# Patient Record
Sex: Female | Born: 1945 | Race: White | Hispanic: No | Marital: Married | Smoking: Never smoker
Health system: Southern US, Community
[De-identification: ages and names within clinical notes are randomized; demographics above are authoritative.]

## PROBLEM LIST (undated history)

## (undated) DIAGNOSIS — K219 Gastro-esophageal reflux disease without esophagitis: Secondary | ICD-10-CM

## (undated) DIAGNOSIS — N6019 Diffuse cystic mastopathy of unspecified breast: Secondary | ICD-10-CM

## (undated) DIAGNOSIS — I10 Essential (primary) hypertension: Secondary | ICD-10-CM

## (undated) DIAGNOSIS — E785 Hyperlipidemia, unspecified: Secondary | ICD-10-CM

## (undated) DIAGNOSIS — J349 Unspecified disorder of nose and nasal sinuses: Secondary | ICD-10-CM

## (undated) DIAGNOSIS — B029 Zoster without complications: Secondary | ICD-10-CM

## (undated) DIAGNOSIS — E119 Type 2 diabetes mellitus without complications: Secondary | ICD-10-CM

## (undated) DIAGNOSIS — M1990 Unspecified osteoarthritis, unspecified site: Secondary | ICD-10-CM

## (undated) DIAGNOSIS — K760 Fatty (change of) liver, not elsewhere classified: Secondary | ICD-10-CM

## (undated) DIAGNOSIS — E559 Vitamin D deficiency, unspecified: Secondary | ICD-10-CM

## (undated) DIAGNOSIS — K635 Polyp of colon: Secondary | ICD-10-CM

## (undated) DIAGNOSIS — K649 Unspecified hemorrhoids: Secondary | ICD-10-CM

## (undated) HISTORY — DX: Polyp of colon: K63.5

## (undated) HISTORY — PX: BLEPHAROPLASTY: SUR158

## (undated) HISTORY — DX: Gastro-esophageal reflux disease without esophagitis: K21.9

## (undated) HISTORY — DX: Zoster without complications: B02.9

## (undated) HISTORY — DX: Unspecified disorder of nose and nasal sinuses: J34.9

## (undated) HISTORY — DX: Diffuse cystic mastopathy of unspecified breast: N60.19

## (undated) HISTORY — PX: TONSILLECTOMY: SUR1361

## (undated) HISTORY — DX: Unspecified hemorrhoids: K64.9

## (undated) HISTORY — PX: OTHER SURGICAL HISTORY: SHX169

## (undated) HISTORY — PX: CATARACT EXTRACTION, BILATERAL: SHX1313

---

## 1990-02-13 HISTORY — PX: ABDOMINAL HYSTERECTOMY: SHX81

## 1998-02-13 HISTORY — PX: NISSEN FUNDOPLICATION: SHX2091

## 2004-06-30 ENCOUNTER — Ambulatory Visit: Payer: Self-pay | Admitting: General Surgery

## 2005-07-18 ENCOUNTER — Ambulatory Visit: Payer: Self-pay | Admitting: General Surgery

## 2006-07-19 ENCOUNTER — Ambulatory Visit: Payer: Self-pay | Admitting: General Surgery

## 2007-02-14 HISTORY — PX: COLONOSCOPY: SHX174

## 2007-04-29 ENCOUNTER — Ambulatory Visit: Payer: Self-pay | Admitting: Unknown Physician Specialty

## 2007-06-24 ENCOUNTER — Ambulatory Visit: Payer: Self-pay | Admitting: Unknown Physician Specialty

## 2007-07-30 ENCOUNTER — Ambulatory Visit: Payer: Self-pay | Admitting: General Surgery

## 2007-08-12 ENCOUNTER — Ambulatory Visit: Payer: Self-pay | Admitting: Urology

## 2008-02-14 HISTORY — PX: NASAL RECONSTRUCTION: SHX2069

## 2008-08-14 ENCOUNTER — Ambulatory Visit: Payer: Self-pay | Admitting: General Surgery

## 2009-06-01 ENCOUNTER — Ambulatory Visit: Payer: Self-pay | Admitting: Family Medicine

## 2009-08-17 ENCOUNTER — Ambulatory Visit: Payer: Self-pay | Admitting: General Surgery

## 2010-02-13 DIAGNOSIS — B029 Zoster without complications: Secondary | ICD-10-CM

## 2010-02-13 HISTORY — PX: NASAL SINUS SURGERY: SHX719

## 2010-02-13 HISTORY — DX: Zoster without complications: B02.9

## 2010-08-19 ENCOUNTER — Ambulatory Visit: Payer: Self-pay | Admitting: General Surgery

## 2010-09-20 ENCOUNTER — Ambulatory Visit: Payer: Self-pay | Admitting: Specialist

## 2010-09-27 ENCOUNTER — Ambulatory Visit: Payer: Self-pay | Admitting: Specialist

## 2011-04-20 ENCOUNTER — Ambulatory Visit: Payer: Self-pay | Admitting: Otolaryngology

## 2011-04-24 LAB — PATHOLOGY REPORT

## 2011-08-22 ENCOUNTER — Ambulatory Visit: Payer: Self-pay | Admitting: General Surgery

## 2012-08-22 ENCOUNTER — Ambulatory Visit: Payer: Self-pay | Admitting: General Surgery

## 2012-08-22 ENCOUNTER — Encounter: Payer: Self-pay | Admitting: General Surgery

## 2012-09-17 ENCOUNTER — Encounter: Payer: Self-pay | Admitting: General Surgery

## 2012-09-17 ENCOUNTER — Ambulatory Visit (INDEPENDENT_AMBULATORY_CARE_PROVIDER_SITE_OTHER): Payer: BC Managed Care – PPO | Admitting: General Surgery

## 2012-09-17 VITALS — BP 130/74 | HR 76 | Resp 14 | Ht 65.0 in | Wt 210.0 lb

## 2012-09-17 DIAGNOSIS — Z87898 Personal history of other specified conditions: Secondary | ICD-10-CM | POA: Insufficient documentation

## 2012-09-17 DIAGNOSIS — Z1239 Encounter for other screening for malignant neoplasm of breast: Secondary | ICD-10-CM

## 2012-09-17 NOTE — Patient Instructions (Addendum)
Patient to return in 1 year with bilateral screening mammogram. Continue self breast checks.

## 2012-09-17 NOTE — Progress Notes (Signed)
Patient ID: Brianna Dudley, female   DOB: 1945-04-17, 67 y.o.   MRN: [id number]  Chief Complaint  Patient presents with  . Follow-up    mammogram    HPI Brianna Dudley is a 67 y.o. female.  who presents for her annual breast evaluation. The most recent mammogram was done on 08-22-12 with a birad category 2 . Patient does perform regular self breast checks and gets regular mammograms done.  The patient denies any problems with the breasts at this time.   HPI  Past Medical History  Diagnosis Date  . Diffuse cystic mastopathy   . Shingles 2012  . Colon polyp   . GERD (gastroesophageal reflux disease)   . Sinus problem   . Hemorrhoid     Past Surgical History  Procedure Laterality Date  . Colonoscopy  2009    Dr Mechele Collin  . Nasal reconstruction  2010  . Vocal chord surgery     . Nissen fundoplication  2000  . Abdominal hysterectomy  1992  . Nasal sinus surgery  2012    Family History  Problem Relation Age of Onset  . Heart disease Mother   . Cancer Father     Social History History  Substance Use Topics  . Smoking status: Never Smoker   . Smokeless tobacco: Never Used  . Alcohol Use: No    Allergies  Allergen Reactions  . Codeine Nausea Only  . Penicillins Hives    Current Outpatient Prescriptions  Medication Sig Dispense Refill  . atorvastatin (LIPITOR) 40 MG tablet Take 40 mg by mouth daily.       Marland Kitchen FLUTICASONE PROPIONATE, NASAL, NA Place 2 puffs into the nose daily.      Marland Kitchen lisinopril-hydrochlorothiazide (PRINZIDE,ZESTORETIC) 20-12.5 MG per tablet Take 1 tablet by mouth daily.       . Multiple Vitamin (MULTIVITAMIN) capsule Take 1 capsule by mouth daily.       No current facility-administered medications for this visit.    Review of Systems Review of Systems  Constitutional: Negative.   Respiratory: Negative.   Cardiovascular: Negative.     Blood pressure 130/74, pulse 76, resp. rate 14, height 5\' 5"  (1.651 m), weight 210 lb (95.255 kg).  Physical  Exam Physical Exam  Constitutional: She is oriented to person, place, and time. She appears well-developed and well-nourished.  Eyes: Conjunctivae are normal. No scleral icterus.  Neck: No thyromegaly present.  Cardiovascular: Normal rate, regular rhythm and normal heart sounds.   No murmur heard. Pulmonary/Chest: Effort normal and breath sounds normal. Right breast exhibits no inverted nipple, no mass, no nipple discharge, no skin change and no tenderness. Left breast exhibits no inverted nipple, no mass, no nipple discharge, no skin change and no tenderness.  Abdominal: Soft. Bowel sounds are normal. There is no tenderness.  Lymphadenopathy:    She has no cervical adenopathy.    She has no axillary adenopathy.  Neurological: She is alert and oriented to person, place, and time.  Skin: Skin is warm and dry.    Data Reviewed  Mammogram reviewed.   Assessment    Exam stable.        Plan    Return in 1 year with bilateral screening mammogram.        Brianna Dudley G 09/17/2012, 7:37 PM

## 2012-12-09 ENCOUNTER — Ambulatory Visit: Payer: Self-pay | Admitting: Unknown Physician Specialty

## 2013-09-25 ENCOUNTER — Ambulatory Visit: Payer: BC Managed Care – PPO | Admitting: General Surgery

## 2013-10-13 ENCOUNTER — Other Ambulatory Visit: Payer: Medicare PPO

## 2013-10-13 ENCOUNTER — Encounter: Payer: Self-pay | Admitting: General Surgery

## 2013-10-13 ENCOUNTER — Ambulatory Visit (INDEPENDENT_AMBULATORY_CARE_PROVIDER_SITE_OTHER): Payer: Medicare PPO | Admitting: General Surgery

## 2013-10-13 VITALS — BP 110/80 | HR 68 | Resp 16 | Ht 64.0 in | Wt 208.0 lb

## 2013-10-13 DIAGNOSIS — N644 Mastodynia: Secondary | ICD-10-CM | POA: Diagnosis not present

## 2013-10-13 DIAGNOSIS — Z87898 Personal history of other specified conditions: Secondary | ICD-10-CM | POA: Diagnosis not present

## 2013-10-13 NOTE — Patient Instructions (Addendum)
This patient is to have a bilateral screening mammogram at the [facility] on 11-12-13 at 8:20 am. She is aware of date, time, and instructions.   Patient to return in 1 year for follow up. Continue self breast exams. Call office for any new breast issues or concerns.

## 2013-10-13 NOTE — Progress Notes (Signed)
Patient ID: KARYME MCCONATHY, female   DOB: 04/09/1945, 68 y.o.   MRN: [id number]  Chief Complaint  Patient presents with  . Follow-up    right nipple pain    HPI ROSAELENA KEMNITZ is a 68 y.o. female who presents for an evaluation of right nipple pain. No mammogram done at this time. The pain started approximately 1 month ago. She states it feels like a bruise when touching the area. No discharge noted. Overall doing well.   HPI  Past Medical History  Diagnosis Date  . Diffuse cystic mastopathy   . Shingles 2012  . Colon polyp   . GERD (gastroesophageal reflux disease)   . Sinus problem   . Hemorrhoid     Past Surgical History  Procedure Laterality Date  . Colonoscopy  2009    Dr Mechele Collin  . Nasal reconstruction  2010  . Vocal chord surgery     . Nissen fundoplication  2000  . Abdominal hysterectomy  1992  . Nasal sinus surgery  2012    Family History  Problem Relation Age of Onset  . Heart disease Mother   . Cancer Father     Social History History  Substance Use Topics  . Smoking status: Never Smoker   . Smokeless tobacco: Never Used  . Alcohol Use: No    Allergies  Allergen Reactions  . Codeine Nausea Only  . Penicillins Hives    Current Outpatient Prescriptions  Medication Sig Dispense Refill  . atorvastatin (LIPITOR) 40 MG tablet Take 40 mg by mouth daily.       Marland Kitchen lisinopril-hydrochlorothiazide (PRINZIDE,ZESTORETIC) 20-12.5 MG per tablet Take 1 tablet by mouth daily.       Marland Kitchen FLUTICASONE PROPIONATE, NASAL, NA Place 2 puffs into the nose daily.      . Multiple Vitamin (MULTIVITAMIN) capsule Take 1 capsule by mouth daily.       No current facility-administered medications for this visit.    Review of Systems Review of Systems  Constitutional: Negative.   Respiratory: Negative.   Cardiovascular: Negative.     Blood pressure 110/80, pulse 68, resp. rate 16, height  (1.626 m), weight 208 lb (94.348 kg).  Physical Exam Physical Exam   Constitutional: She is oriented to person, place, and time. She appears well-developed and well-nourished.  Eyes: Conjunctivae are normal. No scleral icterus.  Neck: Neck supple. No thyromegaly present.  Cardiovascular: Normal rate, regular rhythm and normal heart sounds.   No murmur heard. Pulmonary/Chest: Effort normal and breath sounds normal. Right breast exhibits tenderness (focally tender just medial to the nipple. No palpable mass or discharge.). Right breast exhibits no inverted nipple, no mass, no nipple discharge and no skin change. Left breast exhibits no inverted nipple, no mass, no nipple discharge, no skin change and no tenderness.  Lymphadenopathy:    She has no cervical adenopathy.    She has no axillary adenopathy.  Neurological: She is alert and oriented to person, place, and time.  Skin: Skin is warm and dry.    Data Reviewed Prior notes. [place] of right breast  medial to nipple was performed. No abnormality noted Assessment    Right breast pain focal near nipple-no findings on exam or on [place]. Pt reassured. Has history of FCD     Plan    Patient to have a bilateral screening mammogram at the [facility] on 11-12-13 at 8:20 am. She is aware of date, time, and instructions.   This patient will also  be asked to have a bilateral screening mammogram in one year.        SANKAR,SEEPLAPUTHUR G 10/13/2013, 11:04 AM

## 2013-11-12 ENCOUNTER — Encounter: Payer: Self-pay | Admitting: General Surgery

## 2013-11-12 ENCOUNTER — Ambulatory Visit: Payer: Self-pay | Admitting: General Surgery

## 2013-12-15 ENCOUNTER — Encounter: Payer: Self-pay | Admitting: General Surgery

## 2014-01-29 DIAGNOSIS — E785 Hyperlipidemia, unspecified: Secondary | ICD-10-CM | POA: Insufficient documentation

## 2014-01-29 DIAGNOSIS — I10 Essential (primary) hypertension: Secondary | ICD-10-CM | POA: Insufficient documentation

## 2014-01-29 DIAGNOSIS — E559 Vitamin D deficiency, unspecified: Secondary | ICD-10-CM | POA: Insufficient documentation

## 2014-01-29 DIAGNOSIS — K219 Gastro-esophageal reflux disease without esophagitis: Secondary | ICD-10-CM | POA: Insufficient documentation

## 2014-08-10 DIAGNOSIS — E119 Type 2 diabetes mellitus without complications: Secondary | ICD-10-CM | POA: Insufficient documentation

## 2014-08-24 ENCOUNTER — Other Ambulatory Visit: Payer: Self-pay

## 2014-08-24 DIAGNOSIS — Z1231 Encounter for screening mammogram for malignant neoplasm of breast: Secondary | ICD-10-CM

## 2014-11-16 ENCOUNTER — Ambulatory Visit: Payer: Self-pay

## 2014-11-16 ENCOUNTER — Ambulatory Visit
Admission: RE | Admit: 2014-11-16 | Discharge: 2014-11-16 | Disposition: A | Discharge status: Home / Self Care | Payer: Medicare PPO | Source: Ambulatory Visit | Attending: General Surgery | Admitting: General Surgery

## 2014-11-16 ENCOUNTER — Other Ambulatory Visit: Payer: Self-pay | Admitting: General Surgery

## 2014-11-16 DIAGNOSIS — Z1231 Encounter for screening mammogram for malignant neoplasm of breast: Secondary | ICD-10-CM | POA: Insufficient documentation

## 2014-11-24 ENCOUNTER — Ambulatory Visit: Payer: Medicare PPO | Admitting: General Surgery

## 2015-08-26 ENCOUNTER — Other Ambulatory Visit: Payer: Self-pay | Admitting: Physician Assistant

## 2015-08-26 DIAGNOSIS — Z1231 Encounter for screening mammogram for malignant neoplasm of breast: Secondary | ICD-10-CM

## 2015-10-10 ENCOUNTER — Ambulatory Visit (INDEPENDENT_AMBULATORY_CARE_PROVIDER_SITE_OTHER): Payer: Medicare PPO

## 2015-10-10 ENCOUNTER — Ambulatory Visit
Admission: EM | Admit: 2015-10-10 | Discharge: 2015-10-10 | Disposition: A | Discharge status: Home / Self Care | Payer: Medicare PPO | Attending: Family Medicine | Admitting: Family Medicine

## 2015-10-10 DIAGNOSIS — S20212A Contusion of left front wall of thorax, initial encounter: Secondary | ICD-10-CM

## 2015-10-10 MED ORDER — NAPROXEN 500 MG PO TABS: 500.0000 mg | ORAL_TABLET | Freq: Two times a day (BID) | ORAL | 0 refills | Status: DC

## 2015-10-10 MED ORDER — TRAMADOL HCL 50 MG PO TABS
50.0000 mg | ORAL_TABLET | Freq: Every evening | ORAL | 0 refills | Status: DC | PRN
Start: 2015-10-10 — End: 2020-04-13

## 2015-10-10 NOTE — ED Provider Notes (Signed)
CSN: [id number]     Arrival date & time 10/10/15  0800 History   First MD Initiated Contact with Patient 10/10/15 0820     Chief Complaint  Patient presents with  . Fall   (Consider location/radiation/quality/duration/timing/severity/associated sxs/prior Treatment) 70 year old female presents with left sided rib pain from fall 2 days ago. She was power-washing her driveway when she stepped in a hole in the lawn and fell on her left side. She hit her shoulder, ribs and hip but only her ribs hurt now. She has taken some Tylenol PM which helped her sleep last night. Pain is worse when she coughs or laughs. No previous injury to chest/ribs.    The history is provided by the patient.  Fall  Pertinent negatives include no chest pain and no abdominal pain.    Past Medical History:  Diagnosis Date  . Colon polyp   . Diffuse cystic mastopathy   . GERD (gastroesophageal reflux disease)   . Hemorrhoid   . Shingles 2012  . Sinus problem    Past Surgical History:  Procedure Laterality Date  . ABDOMINAL HYSTERECTOMY  1992  . COLONOSCOPY  2009   Dr Mechele CollinElliott  . NASAL RECONSTRUCTION  2010  . NASAL SINUS SURGERY  2012  . NISSEN FUNDOPLICATION  2000  . vocal chord surgery      Family History  Problem Relation Age of Onset  . Heart disease Mother   . Cancer Father    Social History  Substance Use Topics  . Smoking status: Never Smoker  . Smokeless tobacco: Never Used  . Alcohol use No   OB History    Gravida Para Term Preterm AB Living   2 2           SAB TAB Ectopic Multiple Live Births                  Obstetric Comments   1st Menstrual Cycle:  15 1st Pregnancy:  25     Review of Systems  Constitutional: Negative for chills, fatigue and fever.  HENT: Positive for congestion and postnasal drip.   Respiratory: Positive for cough.   Cardiovascular: Negative for chest pain.  Gastrointestinal: Negative for abdominal pain.  Genitourinary: Negative for difficulty urinating,  dysuria and flank pain.  Musculoskeletal: Positive for arthralgias (rib pain). Negative for back pain, joint swelling and neck pain.  Skin: Negative.   Neurological: Negative for dizziness, syncope, weakness and light-headedness.    Allergies  Codeine and Penicillins  Home Medications   Prior to Admission medications   Medication Sig Start Date End Date Taking? Authorizing Provider  atorvastatin (LIPITOR) 40 MG tablet Take 40 mg by mouth daily.  08/01/12  Yes Historical Provider, MD  azelastine (ASTELIN) 0.1 % nasal spray Place 1 spray into both nostrils 2 (two) times daily. Use in each nostril as directed   Yes Historical Provider, MD  lisinopril-hydrochlorothiazide (PRINZIDE,ZESTORETIC) 20-12.5 MG per tablet Take 1 tablet by mouth daily.  08/01/12  Yes Historical Provider, MD  Multiple Vitamin (MULTIVITAMIN) capsule Take 1 capsule by mouth daily.   Yes Historical Provider, MD  naproxen (NAPROSYN) 500 MG tablet Take 1 tablet (500 mg total) by mouth 2 (two) times daily. 10/10/15   Sudie GrumblingAnn Berry Derrell Milanes, NP  traMADol (ULTRAM) 50 MG tablet Take 1 tablet (50 mg total) by mouth at bedtime as needed for moderate pain. 10/10/15   Sudie GrumblingAnn Berry Torrey Ballinas, NP   Meds Ordered and Administered this Visit  Medications - No data  to display  BP 130/65 (BP Location: Right Arm)   Pulse 88   Temp 98 F (36.7 C) (Oral)   Resp 18   Ht 5\' 4"  (1.626 m)   Wt 198 lb (89.8 kg)   SpO2 96%   BMI 33.99 kg/m  No data found.   Physical Exam  Constitutional: She is oriented to person, place, and time. She appears well-developed and well-nourished. No distress.  HENT:  Head: Normocephalic and atraumatic.  Neck: Normal range of motion. Neck supple.  Cardiovascular: Normal rate, regular rhythm and normal heart sounds.     Tenderness near the 7th and 8th rib line mid-clavicular to axillary. No bruising or swelling present.   Pulmonary/Chest: Effort normal and breath sounds normal. No respiratory distress. She has no  wheezes. She exhibits tenderness. She exhibits no crepitus, no edema and no swelling.  Musculoskeletal: Normal range of motion. She exhibits tenderness.  Lymphadenopathy:    She has no cervical adenopathy.  Neurological: She is alert and oriented to person, place, and time.  Skin: Skin is warm and dry. Capillary refill takes less than 2 seconds.  Psychiatric: She has a normal mood and affect. Her behavior is normal. Judgment and thought content normal.    Urgent Care Course   Clinical Course    Procedures (including critical care time)  Labs Review Labs Reviewed - No data to display  Imaging Review Dg Ribs Unilateral W/chest Left  Result Date: 10/10/2015 CLINICAL DATA:  LEFT-sided chest pain, fall.  Fall 2 days prior. EXAM: LEFT RIBS AND CHEST - 3+ VIEW COMPARISON:  None. FINDINGS: Normal cardiac silhouette. No pneumothorax or pulmonary contusion. Dedicated views of the LEFT ribs demonstrate no displaced fracture. IMPRESSION: No fracture or pneumothorax. Electronically Signed   By: Genevive Bi M.D.   On: 10/10/2015 08:58     Visual Acuity Review  Right Eye Distance:   Left Eye Distance:   Bilateral Distance:    Right Eye Near:   Left Eye Near:    Bilateral Near:         MDM   1. Chest wall contusion, left, initial encounter    Reviewed imaging results which showed no fracture. Discussed that she most likely bruised her rib cage. Recommend Naproxen 500mg  every 12 hours as needed for pain. Offered various cough medication to help decrease cough aggravating rib pain but she denied- "nothing has worked in the past. I have a chronic cough with allergies that my ENT is still trying to manage". Provided Rx for Tramadol 50mg  #7 to take 1 at bedtime for pain. Recommend follow-up with her primary care provider if pain is not improving within 7 to 10 days.     Sudie Grumbling, NP 10/11/15 [phone]

## 2015-10-10 NOTE — ED Triage Notes (Signed)
Patient was pressure washing her driveway and she stepped back into a hole. This happened Friday evening.

## 2015-10-10 NOTE — Discharge Instructions (Signed)
Take Naproxen twice a day as needed for pain. May take Tramadol at night to help with pain and to help sleep. Encouraged to continue to breathe normally and cough as needed to minimize risk of further complications. Follow-up with your primary care provider within 5 days if not improving.

## 2015-11-18 ENCOUNTER — Other Ambulatory Visit: Payer: Self-pay | Admitting: Physician Assistant

## 2015-11-18 ENCOUNTER — Ambulatory Visit
Admission: RE | Admit: 2015-11-18 | Discharge: 2015-11-18 | Disposition: A | Discharge status: Home / Self Care | Payer: Medicare PPO | Source: Ambulatory Visit | Attending: Physician Assistant | Admitting: Physician Assistant

## 2015-11-18 DIAGNOSIS — Z1231 Encounter for screening mammogram for malignant neoplasm of breast: Secondary | ICD-10-CM

## 2016-10-09 ENCOUNTER — Other Ambulatory Visit: Payer: Self-pay | Admitting: Physician Assistant

## 2016-10-09 DIAGNOSIS — Z1231 Encounter for screening mammogram for malignant neoplasm of breast: Secondary | ICD-10-CM

## 2016-11-20 ENCOUNTER — Ambulatory Visit
Admission: RE | Admit: 2016-11-20 | Discharge: 2016-11-20 | Disposition: A | Discharge status: Home / Self Care | Payer: Medicare PPO | Source: Ambulatory Visit | Attending: Physician Assistant | Admitting: Physician Assistant

## 2016-11-20 DIAGNOSIS — Z1231 Encounter for screening mammogram for malignant neoplasm of breast: Secondary | ICD-10-CM | POA: Insufficient documentation

## 2016-11-28 ENCOUNTER — Other Ambulatory Visit: Payer: Self-pay | Admitting: Otolaryngology

## 2016-11-28 DIAGNOSIS — R221 Localized swelling, mass and lump, neck: Secondary | ICD-10-CM

## 2016-12-07 ENCOUNTER — Ambulatory Visit
Admission: RE | Admit: 2016-12-07 | Discharge: 2016-12-07 | Disposition: A | Discharge status: Home / Self Care | Payer: Medicare PPO | Source: Ambulatory Visit | Attending: Otolaryngology | Admitting: Otolaryngology

## 2016-12-07 DIAGNOSIS — R600 Localized edema: Secondary | ICD-10-CM | POA: Insufficient documentation

## 2016-12-07 DIAGNOSIS — H9209 Otalgia, unspecified ear: Secondary | ICD-10-CM | POA: Diagnosis present

## 2016-12-07 DIAGNOSIS — R221 Localized swelling, mass and lump, neck: Secondary | ICD-10-CM

## 2016-12-07 HISTORY — DX: Essential (primary) hypertension: I10

## 2016-12-07 LAB — POCT I-STAT CREATININE: Creatinine, Ser: 0.9 mg/dL (ref 0.44–1.00)

## 2016-12-07 MED ORDER — IOPAMIDOL (ISOVUE-300) INJECTION 61%
75.0000 mL | Freq: Once | INTRAVENOUS | Status: AC | PRN
  Administered 2016-12-07: 75 mL via INTRAVENOUS

## 2017-10-17 ENCOUNTER — Other Ambulatory Visit: Payer: Self-pay | Admitting: Physician Assistant

## 2017-10-17 DIAGNOSIS — Z1231 Encounter for screening mammogram for malignant neoplasm of breast: Secondary | ICD-10-CM

## 2017-11-21 ENCOUNTER — Ambulatory Visit
Admission: RE | Admit: 2017-11-21 | Discharge: 2017-11-21 | Disposition: A | Discharge status: Home / Self Care | Payer: Medicare PPO | Source: Ambulatory Visit | Attending: Physician Assistant | Admitting: Physician Assistant

## 2017-11-21 DIAGNOSIS — Z1231 Encounter for screening mammogram for malignant neoplasm of breast: Secondary | ICD-10-CM | POA: Diagnosis not present

## 2017-11-28 DIAGNOSIS — Z86010 Personal history of colonic polyps: Secondary | ICD-10-CM | POA: Insufficient documentation

## 2018-04-09 ENCOUNTER — Other Ambulatory Visit: Payer: Self-pay | Admitting: Physician Assistant

## 2018-04-09 ENCOUNTER — Other Ambulatory Visit (HOSPITAL_COMMUNITY): Payer: Self-pay | Admitting: Physician Assistant

## 2018-04-09 ENCOUNTER — Other Ambulatory Visit (HOSPITAL_COMMUNITY): Payer: Self-pay

## 2018-04-09 DIAGNOSIS — R945 Abnormal results of liver function studies: Principal | ICD-10-CM

## 2018-04-09 DIAGNOSIS — R7989 Other specified abnormal findings of blood chemistry: Secondary | ICD-10-CM

## 2018-04-11 ENCOUNTER — Ambulatory Visit
Admission: RE | Admit: 2018-04-11 | Discharge: 2018-04-11 | Disposition: A | Discharge status: Home / Self Care | Payer: Medicare Other | Source: Ambulatory Visit | Attending: Physician Assistant | Admitting: Physician Assistant

## 2018-04-11 DIAGNOSIS — R7989 Other specified abnormal findings of blood chemistry: Secondary | ICD-10-CM

## 2018-04-11 DIAGNOSIS — R945 Abnormal results of liver function studies: Secondary | ICD-10-CM | POA: Insufficient documentation

## 2018-10-08 ENCOUNTER — Other Ambulatory Visit: Payer: Self-pay | Admitting: Physician Assistant

## 2018-10-08 DIAGNOSIS — Z1231 Encounter for screening mammogram for malignant neoplasm of breast: Secondary | ICD-10-CM

## 2018-11-25 ENCOUNTER — Ambulatory Visit
Admission: RE | Admit: 2018-11-25 | Discharge: 2018-11-25 | Disposition: A | Discharge status: Home / Self Care | Payer: Medicare Other | Source: Ambulatory Visit | Attending: Physician Assistant | Admitting: Physician Assistant

## 2018-11-25 DIAGNOSIS — Z1231 Encounter for screening mammogram for malignant neoplasm of breast: Secondary | ICD-10-CM | POA: Insufficient documentation

## 2019-03-24 ENCOUNTER — Other Ambulatory Visit: Payer: Medicare Other

## 2019-04-10 ENCOUNTER — Ambulatory Visit: Payer: Medicare Other | Attending: Internal Medicine

## 2019-04-10 DIAGNOSIS — Z23 Encounter for immunization: Secondary | ICD-10-CM

## 2019-04-10 NOTE — Progress Notes (Signed)
   Covid-19 Vaccination Clinic  Name:  Brianna Dudley    MRN: [id number] DOB: August 22, 1945  04/10/2019  Brianna Dudley was observed post Covid-19 immunization for 15 minutes without incidence. She was provided with Vaccine Information Sheet and instruction to access the V-Safe system.   Brianna Dudley was instructed to call 911 with any severe reactions post vaccine: Marland Kitchen Difficulty breathing  . Swelling of your face and throat  . A fast heartbeat  . A bad rash all over your body  . Dizziness and weakness    Immunizations Administered    Name Date Dose VIS Date Route   Pfizer COVID-19 Vaccine 04/10/2019  9:58 AM 0.3 mL 01/24/2019 Intramuscular   Manufacturer: [employer], [employer]   Lot: [id number]   NDC: [address]-8

## 2019-05-06 ENCOUNTER — Ambulatory Visit: Payer: Medicare Other | Attending: Internal Medicine

## 2019-05-06 DIAGNOSIS — Z23 Encounter for immunization: Secondary | ICD-10-CM

## 2019-05-06 NOTE — Progress Notes (Signed)
   Covid-19 Vaccination Clinic  Name:  CYANA SHOOK    MRN: [id number] DOB: [birth year]  05/06/2019  Ms. Tilly was observed post Covid-19 immunization for 15 minutes without incident. She was provided with Vaccine Information Sheet and instruction to access the V-Safe system.   Ms. Galloway was instructed to call 911 with any severe reactions post vaccine: Marland Kitchen Difficulty breathing  . Swelling of face and throat  . A fast heartbeat  . A bad rash all over body  . Dizziness and weakness   Immunizations Administered    Name Date Dose VIS Date Route   Pfizer COVID-19 Vaccine 05/06/2019  9:42 AM 0.3 mL 01/24/2019 Intramuscular   Manufacturer: [employer], [employer]   Lot: [id number]   NDC: [address]-2

## 2019-09-30 ENCOUNTER — Other Ambulatory Visit: Payer: Self-pay | Admitting: Sports Medicine

## 2019-09-30 DIAGNOSIS — M7052 Other bursitis of knee, left knee: Secondary | ICD-10-CM

## 2019-09-30 DIAGNOSIS — M1712 Unilateral primary osteoarthritis, left knee: Secondary | ICD-10-CM

## 2019-09-30 DIAGNOSIS — G8929 Other chronic pain: Secondary | ICD-10-CM

## 2019-09-30 DIAGNOSIS — M25562 Pain in left knee: Secondary | ICD-10-CM

## 2019-10-17 ENCOUNTER — Ambulatory Visit
Admission: RE | Admit: 2019-10-17 | Discharge: 2019-10-17 | Disposition: A | Discharge status: Home / Self Care | Payer: Medicare Other | Source: Ambulatory Visit | Attending: Sports Medicine | Admitting: Sports Medicine

## 2019-10-17 ENCOUNTER — Other Ambulatory Visit: Payer: Self-pay

## 2019-10-17 DIAGNOSIS — G8929 Other chronic pain: Secondary | ICD-10-CM | POA: Diagnosis present

## 2019-10-17 DIAGNOSIS — M7052 Other bursitis of knee, left knee: Secondary | ICD-10-CM | POA: Diagnosis present

## 2019-10-17 DIAGNOSIS — M1712 Unilateral primary osteoarthritis, left knee: Secondary | ICD-10-CM | POA: Diagnosis present

## 2019-10-17 DIAGNOSIS — M25562 Pain in left knee: Secondary | ICD-10-CM | POA: Insufficient documentation

## 2019-10-21 ENCOUNTER — Other Ambulatory Visit: Payer: Self-pay | Admitting: Physician Assistant

## 2019-10-21 DIAGNOSIS — Z1231 Encounter for screening mammogram for malignant neoplasm of breast: Secondary | ICD-10-CM

## 2019-11-27 ENCOUNTER — Ambulatory Visit
Admission: RE | Admit: 2019-11-27 | Discharge: 2019-11-27 | Disposition: A | Discharge status: Home / Self Care | Payer: Medicare Other | Source: Ambulatory Visit | Attending: Physician Assistant | Admitting: Physician Assistant

## 2019-11-27 ENCOUNTER — Other Ambulatory Visit: Payer: Self-pay

## 2019-11-27 DIAGNOSIS — Z1231 Encounter for screening mammogram for malignant neoplasm of breast: Secondary | ICD-10-CM

## 2019-12-14 DIAGNOSIS — M1712 Unilateral primary osteoarthritis, left knee: Secondary | ICD-10-CM | POA: Insufficient documentation

## 2019-12-14 DIAGNOSIS — K760 Fatty (change of) liver, not elsewhere classified: Secondary | ICD-10-CM | POA: Insufficient documentation

## 2020-02-15 NOTE — Discharge Instructions (Signed)
Instructions after Total Knee Replacement   Brianna Dudley, Jr., M.D.     Dept. of Orthopaedics & Sports Medicine  [facility]  [address]  [place], [place]  [address]  Phone: [phone]   Fax: [phone]    DIET: Drink plenty of non-alcoholic fluids. Resume your normal diet. Include foods high in fiber.  ACTIVITY:  You may use crutches or a walker with weight-bearing as tolerated, unless instructed otherwise. You may be weaned off of the walker or crutches by your Physical Therapist.  Do NOT place pillows under the knee. Anything placed under the knee could limit your ability to straighten the knee.   Continue doing gentle exercises. Exercising will reduce the pain and swelling, increase motion, and prevent muscle weakness.   Please continue to use the TED compression stockings for 6 weeks. You may remove the stockings at night, but should reapply them in the morning. Do not drive or operate any equipment until instructed.  WOUND CARE:  Continue to use the PolarCare or ice packs periodically to reduce pain and swelling. You may bathe or shower after the staples are removed at the first office visit following surgery.  MEDICATIONS: You may resume your regular medications. Please take the pain medication as prescribed on the medication. Do not take pain medication on an empty stomach. You have been given a prescription for a blood thinner (Lovenox or Coumadin). Please take the medication as instructed. (NOTE: After completing a 2 week course of Lovenox, take one Enteric-coated aspirin once a day. This along with elevation will help reduce the possibility of phlebitis in your operated leg.) Do not drive or drink alcoholic beverages when taking pain medications.  CALL THE OFFICE FOR: Temperature above 101 degrees Excessive bleeding or drainage on the dressing. Excessive swelling, coldness, or paleness of the toes. Persistent nausea and vomiting.  FOLLOW-UP:  You  should have an appointment to return to the office in 10-14 days after surgery. Arrangements have been made for continuation of Physical Therapy (either home therapy or outpatient therapy).   [facility] Department Directory         [email or web address]       [email or web address]          Cardiology  Appointments: [place] - [phone] [place] - [phone]  Endocrinology  Appointments: [place] - [phone] [place] - [phone]  Gastroenterology  Appointments: [place] - [phone] [place] - [phone]        General Surgery   Appointments: [place] - [phone]  Internal Medicine/Family Medicine  Appointments: [place] - [phone] [place] - [phone] [place] - [phone]  Metabolic and Weigh Loss Surgery  Appointments: [place] - [phone]        Neurology  Appointments: [place] - [phone] [place] - [phone]  Neurosurgery  Appointments: [place] - [phone]  Obstetrics & Gynecology  Appointments: [place] - [phone] [place] - [phone]        Pediatrics  Appointments: [place] - [phone] [place] - [phone]  Physiatry  Appointments: [place] -[phone]  Physical Therapy  Appointments: [place] - [phone] [place] - [phone]        Podiatry  Appointments: [place] - [phone] [place] - [phone]  Pulmonology  Appointments: [place] - [phone]  Rheumatology  Appointments:  - [phone]         Location: [facility]  [address] , [place]  [address]  [place] Location: [facility] [address] [place], [place]  [address]  [place] Location: [facility] [address] [place], [place]  [address]    

## 2020-03-12 ENCOUNTER — Inpatient Hospital Stay: Admit: 2020-03-12 | Payer: Medicare Other | Admitting: Orthopedic Surgery

## 2020-03-12 SURGERY — ARTHROPLASTY, KNEE, TOTAL, USING IMAGELESS COMPUTER-ASSISTED NAVIGATION
Anesthesia: Choice | Site: Knee | Laterality: Left

## 2020-04-16 NOTE — Discharge Instructions (Signed)
Instructions after Total Knee Replacement   Jeriann Sayres P. Carvin Almas, Jr., M.D.     Dept. of Orthopaedics & Sports Medicine  [facility]  [address]  [place], [place]  [address]  Phone: [phone]   Fax: [phone]    DIET: Drink plenty of non-alcoholic fluids. Resume your normal diet. Include foods high in fiber.  ACTIVITY:  You may use crutches or a walker with weight-bearing as tolerated, unless instructed otherwise. You may be weaned off of the walker or crutches by your Physical Therapist.  Do NOT place pillows under the knee. Anything placed under the knee could limit your ability to straighten the knee.   Continue doing gentle exercises. Exercising will reduce the pain and swelling, increase motion, and prevent muscle weakness.   Please continue to use the TED compression stockings for 6 weeks. You may remove the stockings at night, but should reapply them in the morning. Do not drive or operate any equipment until instructed.  WOUND CARE:  Continue to use the PolarCare or ice packs periodically to reduce pain and swelling. You may bathe or shower after the staples are removed at the first office visit following surgery.  MEDICATIONS: You may resume your regular medications. Please take the pain medication as prescribed on the medication. Do not take pain medication on an empty stomach. You have been given a prescription for a blood thinner (Lovenox or Coumadin). Please take the medication as instructed. (NOTE: After completing a 2 week course of Lovenox, take one Enteric-coated aspirin once a day. This along with elevation will help reduce the possibility of phlebitis in your operated leg.) Do not drive or drink alcoholic beverages when taking pain medications.  CALL THE OFFICE FOR: Temperature above 101 degrees Excessive bleeding or drainage on the dressing. Excessive swelling, coldness, or paleness of the toes. Persistent nausea and vomiting.  FOLLOW-UP:  You  should have an appointment to return to the office in 10-14 days after surgery. Arrangements have been made for continuation of Physical Therapy (either home therapy or outpatient therapy).   [facility] Department Directory         [email or web address]       [email or web address]          Cardiology  Appointments: [place] - [phone] [place] - [phone]  Endocrinology  Appointments: [place] - [phone] [place] - [phone]  Gastroenterology  Appointments: [place] - [phone] [place] - [phone]        General Surgery   Appointments: [place] - [phone]  Internal Medicine/Family Medicine  Appointments: [place] - [phone] [place] - [phone] [place] - [phone]  Metabolic and Weigh Loss Surgery  Appointments: [place] - [phone]        Neurology  Appointments: [place] - [phone] [place] - [phone]  Neurosurgery  Appointments: [place] - [phone]  Obstetrics & Gynecology  Appointments: [place] - [phone] [place] - [phone]        Pediatrics  Appointments: [place] - [phone] [place] - [phone]  Physiatry  Appointments: [place] -[phone]  Physical Therapy  Appointments: [place] - [phone] [place] - [phone]        Podiatry  Appointments: [place] - [phone] [place] - [phone]  Pulmonology  Appointments: [place] - [phone]  Rheumatology  Appointments: [place] - [phone]        [place] Location: [facility]  [address] [place], [place]  [address]  [place] Location: [facility] [address] [place], [place]  [address]  [place] Location: [facility] [address] [place], [place]  [address]    

## 2020-04-19 ENCOUNTER — Encounter
Admission: RE | Admit: 2020-04-19 | Discharge: 2020-04-19 | Disposition: A | Discharge status: Home / Self Care | Payer: Medicare Other | Source: Ambulatory Visit | Attending: Orthopedic Surgery | Admitting: Orthopedic Surgery

## 2020-04-19 ENCOUNTER — Other Ambulatory Visit: Payer: Self-pay

## 2020-04-19 DIAGNOSIS — Z01818 Encounter for other preprocedural examination: Secondary | ICD-10-CM | POA: Diagnosis present

## 2020-04-19 HISTORY — DX: Unspecified osteoarthritis, unspecified site: M19.90

## 2020-04-19 HISTORY — DX: Hyperlipidemia, unspecified: E78.5

## 2020-04-19 HISTORY — DX: Vitamin D deficiency, unspecified: E55.9

## 2020-04-19 HISTORY — DX: Type 2 diabetes mellitus without complications: E11.9

## 2020-04-19 HISTORY — DX: Fatty (change of) liver, not elsewhere classified: K76.0

## 2020-04-19 LAB — CBC
HCT: 40.1 % (ref 36.0–46.0)
Hemoglobin: 13.7 g/dL (ref 12.0–15.0)
MCH: 32 pg (ref 26.0–34.0)
MCHC: 34.2 g/dL (ref 30.0–36.0)
MCV: 93.7 fL (ref 80.0–100.0)
Platelets: 171 10*3/uL (ref 150–400)
RBC: 4.28 MIL/uL (ref 3.87–5.11)
RDW: 12 % (ref 11.5–15.5)
WBC: 4.9 10*3/uL (ref 4.0–10.5)
nRBC: 0 % (ref 0.0–0.2)

## 2020-04-19 LAB — COMPREHENSIVE METABOLIC PANEL
ALT: 43 U/L (ref 0–44)
AST: 33 U/L (ref 15–41)
Albumin: 4.3 g/dL (ref 3.5–5.0)
Alkaline Phosphatase: 54 U/L (ref 38–126)
Anion gap: 11 (ref 5–15)
BUN: 26 mg/dL — ABNORMAL HIGH (ref 8–23)
CO2: 28 mmol/L (ref 22–32)
Calcium: 9.8 mg/dL (ref 8.9–10.3)
Chloride: 102 mmol/L (ref 98–111)
Creatinine, Ser: 1 mg/dL (ref 0.44–1.00)
GFR, Estimated: 60 mL/min — ABNORMAL LOW (ref >60)
Glucose, Bld: 87 mg/dL (ref 70–99)
Potassium: 3.8 mmol/L (ref 3.5–5.1)
Sodium: 141 mmol/L (ref 135–145)
Total Bilirubin: 1 mg/dL (ref 0.3–1.2)
Total Protein: 7.6 g/dL (ref 6.5–8.1)

## 2020-04-19 LAB — URINALYSIS, ROUTINE W REFLEX MICROSCOPIC
Bacteria, UA: NONE SEEN
Bilirubin Urine: NEGATIVE
Glucose, UA: NEGATIVE mg/dL
Hgb urine dipstick: NEGATIVE
Ketones, ur: NEGATIVE mg/dL
Nitrite: NEGATIVE
Protein, ur: NEGATIVE mg/dL
Specific Gravity, Urine: 1.015 (ref 1.005–1.030)
pH: 5 (ref 5.0–8.0)

## 2020-04-19 LAB — SURGICAL PCR SCREEN
MRSA, PCR: NEGATIVE
Staphylococcus aureus: NEGATIVE

## 2020-04-19 LAB — SEDIMENTATION RATE: Sed Rate: 24 mm/hr — ABNORMAL HIGH (ref 0–22)

## 2020-04-19 LAB — TYPE AND SCREEN
ABO/RH(D): O NEG
Antibody Screen: NEGATIVE

## 2020-04-19 LAB — APTT: aPTT: 33 seconds (ref 24–36)

## 2020-04-19 LAB — C-REACTIVE PROTEIN: CRP: 0.5 mg/dL (ref <1.0)

## 2020-04-19 LAB — HEMOGLOBIN A1C
Hgb A1c MFr Bld: 6.9 % — ABNORMAL HIGH (ref 4.8–5.6)
Mean Plasma Glucose: 151.33 mg/dL

## 2020-04-19 LAB — PROTIME-INR
INR: 1.1 (ref 0.8–1.2)
Prothrombin Time: 13.4 seconds (ref 11.4–15.2)

## 2020-04-19 NOTE — Pre-Procedure Instructions (Signed)
Abnormal lab results faxed to [facility] office

## 2020-04-19 NOTE — Patient Instructions (Addendum)
Your procedure is scheduled on: April 26, 2020 Monday  Report to the Registration Desk on the 1st floor of the [facility]. To find out your arrival time, please call [phone] between 1PM - 3PM on: Friday April 23, 2020  REMEMBER: Instructions that are not followed completely may result in serious medical risk, up to and including death; or upon the discretion of your surgeon and anesthesiologist your surgery may need to be rescheduled.  Do not eat OR DRINK after midnight the night before surgery.  No gum chewing, lozengers or hard candies.  Do NOT drink anything that is not on this list.  TAKE THESE MEDICATIONS THE MORNING OF SURGERY WITH A SIP OF WATER: ATORVASTATIN  Stop Metformin  2 days prior to surgery. LAST DOSE 04/23/2020 FRIDAY  One week prior to surgery: Stop Anti-inflammatories (NSAIDS) such as Advil, Aleve, Ibuprofen, Motrin, Naproxen, Naprosyn and ASPIRIN OR  Aspirin based products such as Excedrin, Goodys Powder, BC Powder. Stop ANY OVER THE COUNTER supplements until after surgery. VIT D AND MULTIVITAMIN ARE OK  No Alcohol for 24 hours before or after surgery.  No Smoking including e-cigarettes for 24 hours prior to surgery.  No chewable tobacco products for at least 6 hours prior to surgery.  No nicotine patches on the day of surgery.  Do not use any "recreational" drugs for at least a week prior to your surgery.  Please be advised that the combination of cocaine and anesthesia may have negative outcomes, up to and including death. If you test positive for cocaine, your surgery will be cancelled.  On the morning of surgery brush your teeth with toothpaste and water, you may rinse your mouth with mouthwash if you wish. Do not swallow any toothpaste or mouthwash.  Do not wear jewelry, make-up, hairpins, clips or nail polish.  Do not wear lotions, powders, or perfumes OR DEODORANT.  Do not shave body from the neck down 48 hours prior to surgery just in case you  cut yourself which could leave a site for infection.  Also, freshly shaved skin may become irritated if using the CHG soap.  Contact lenses, hearing aids and dentures may not be worn into surgery.  Do not bring valuables to the hospital. [facility] is not responsible for any missing/lost belongings or valuables.   Use CHG Soap  as directed on instruction sheet.  Notify your doctor if there is any change in your medical condition (cold, fever, infection).  Wear comfortable clothing (specific to your surgery type) to the hospital.  Plan for stool softeners for home use; pain medications have a tendency to cause constipation. You can also help prevent constipation by eating foods high in fiber such as fruits and vegetables and drinking plenty of fluids as your diet allows.  After surgery, you can help prevent lung complications by doing breathing exercises.  Take deep breaths and cough every 1-2 hours. Your doctor may order a device called an Incentive Spirometer to help you take deep breaths. When coughing or sneezing, hold a pillow firmly against your incision with both hands. This is called "splinting." Doing this helps protect your incision. It also decreases belly discomfort.  If you are being admitted to the hospital overnight, YOU MAY BRING A SMALL BAG WITH YOU  If you are being discharged the day of surgery, you will not be allowed to drive home. You will need a responsible adult (18 years or older) to drive you home and stay with you that night.  If you are taking public transportation, you will need to have a responsible adult (18 years or older) with you. Please confirm with your physician that it is acceptable to use public transportation.   Please call the Pre-admissions Testing Dept. at [phone] if you have any questions about these instructions.  Surgery Visitation Policy:  Patients undergoing a surgery or procedure may have one family member or support person with  them as long as that person is not COVID-19 positive or experiencing its symptoms.  That person may remain in the waiting area during the procedure.  Inpatient Visitation:    Visiting hours are 7 a.m. to 8 p.m. Inpatients will be allowed two visitors daily. The visitors may change each day during the patient's stay. No visitors under the age of 34. Any visitor under the age of 38 must be accompanied by an adult. The visitor must pass COVID-19 screenings, use hand sanitizer when entering and exiting the patient's room and wear a mask at all times, including in the patient's room. Patients must also wear a mask when staff or their visitor are in the room. Masking is required regardless of vaccination status.

## 2020-04-20 LAB — URINE CULTURE
Culture: NO GROWTH
Special Requests: NORMAL

## 2020-04-22 ENCOUNTER — Other Ambulatory Visit
Admission: RE | Admit: 2020-04-22 | Discharge: 2020-04-22 | Disposition: A | Discharge status: Home / Self Care | Payer: Medicare Other | Source: Ambulatory Visit | Attending: Orthopedic Surgery | Admitting: Orthopedic Surgery

## 2020-04-22 ENCOUNTER — Other Ambulatory Visit: Payer: Self-pay

## 2020-04-22 DIAGNOSIS — Z20822 Contact with and (suspected) exposure to covid-19: Secondary | ICD-10-CM | POA: Diagnosis not present

## 2020-04-22 DIAGNOSIS — Z01812 Encounter for preprocedural laboratory examination: Secondary | ICD-10-CM | POA: Diagnosis present

## 2020-04-22 LAB — SARS CORONAVIRUS 2 (TAT 6-24 HRS): SARS Coronavirus 2: NEGATIVE

## 2020-04-22 LAB — IGE: IgE (Immunoglobulin E), Serum: 10 IU/mL (ref 6–495)

## 2020-04-25 ENCOUNTER — Encounter: Payer: Self-pay | Admitting: Orthopedic Surgery

## 2020-04-25 NOTE — H&P (Signed)
ORTHOPAEDIC HISTORY & PHYSICAL Gwenlyn Fudge, [place] - 04/20/2020 1:45 PM EST Formatting of this note is different from the original. [place] [facility] Chief Complaint:   Chief Complaint  Patient presents with  . Knee Pain  H & P LEFT KNEE   History of Present Illness:   Brianna Dudley is a 75 y.o. female that presents to clinic today for her preoperative history and evaluation. Patient presents unaccompanied. The patient is scheduled to undergo a left total knee arthroplasty on 04/26/20 by Dr. Marry Guan. Her pain began over 1 year ago. The pain is located along the medial and anterior aspect of the knee. She describes her pain as aggravated by going up and down stairs, rising after sitting, standing, and walking. She reports associated swelling with some giving way of the knee. She denies associated numbness or tingling, denies locking.   The patient's symptoms have progressed to the point that they decrease her quality of life. The patient has previously undergone conservative treatment including NSAIDS and injections to the knee without adequate control of her symptoms.  Patient states she had hives the last time she took penicillin which was 30 ears ago. She states she took a related medication in the last few years without any adverse reaction. She doesn't remember the medication.   Denies significant cardiac history, history of blood clots, or history of lumbar surgery.  Past Medical, Surgical, Family, Social History, Allergies, Medications:   Past Medical History:  Past Medical History:  Diagnosis Date  . Allergic state  . Diabetes mellitus without complication (CMS-HCC) don't know  . GERD (gastroesophageal reflux disease)  . History of adenomatous polyp of colon  . Hx of adenomatous colonic polyps 11/28/2017  . Hyperglycemia  . Hyperlipidemia  . Hypertension  . Osteoporosis don't know  . Primary osteoarthritis of left knee  12/14/2019  . Vitamin D deficiency   Past Surgical History:  Past Surgical History:  Procedure Laterality Date  . BLEPHAROPLASTY  . CATARACT EXTRACTION April 2019  . CATARACT EXTRACTION W/ INTRAOCULAR LENS IMPLANT & ANTERIOR VITRECTOMY, BILATERAL Bilateral  . COLONOSCOPY 12/09/2012  PH Adenomatous Polyp: CBF 11/2017: Recall ltr mailed 10/24/17 (kj)  . COLONOSCOPY 06/24/2007  PH Adenomatous Polyp  . COLONOSCOPY 12/27/2017  Sessile Serated Adenoma: CBF 12/2022  . HYSTERECTOMY 1997  wtih BSO  . LAPAROSCOPIC ESOPHAGOGASTRIC FUNDOPLASTY NISSEN PROCEDURE  . NEUROPLASTY VOCAL CORD  . SEPTOPLASTY  . TONSILLECTOMY 1974  . TUBAL LIGATION   Current Medications:  Current Outpatient Medications  Medication Sig Dispense Refill  . atorvastatin (LIPITOR) 40 MG tablet Take 1 tablet (40 mg total) by mouth nightly 90 tablet 1  . blood glucose control high,low Soln As directed 1 each 0  . blood glucose diagnostic test strip Use 1 each (1 strip total) once daily Aviva Strips - Use as instructed. 100 each 3  . blood glucose meter kit Use as directed AccuChek plus Dx E11.9 1 each 3  . calcium carbonate-vitamin D3 (CALTRATE 600+D) 600 mg(1,568m) -400 unit tablet Take 1 tablet by mouth 2 (two) times daily with meals.  . cholecalciferol (VITAMIN D3) 1,000 unit tablet Take 1,000 Units by mouth once daily.  . diclofenac (VOLTAREN) 1 % topical gel Apply 2 g topically once daily as needed  . diphenhydrAMINE-acetaminophen (TYLENOL PM) 25-500 mg per tablet Take 1 tablet by mouth nightly as needed  . hyoscyamine (LEVSIN/SL) 0.125 mg SL tablet Place 1 tablet (0.125 mg total) under the tongue every 4 (four)  hours as needed for Cramping. 90 tablet 1  . lisinopriL-hydrochlorothiazide (ZESTORETIC) 20-12.5 mg tablet Take 1 tablet by mouth once daily 90 tablet 1  . melatonin 5 mg Tab Take 1 tablet by mouth nightly as needed  . metFORMIN (GLUCOPHAGE) 500 MG tablet Take 1 tablet (500 mg total) by mouth 2 (two) times  daily with meals 60 tablet 11  . multivitamin capsule Take by mouth once daily.  . blood glucose diagnostic (ACCU-CHEK GUIDE TEST STRIPS) test strip 1 each (1 strip total) by XX route 3 (three) times daily Use as instructed. 100 each 12  . lancets Use 1 each once daily Use as instructed. With AccuCheck Guide Dx E11.9 100 each 3  . melatonin 3 mg tablet Take 5 mg by mouth nightly as needed   No current facility-administered medications for this visit.   Allergies:  Allergies  Allergen Reactions  . Penicillin V Potassium Hives and Itching  . Codeine Nausea  . Tramadol Nausea   Social History:  Social History   Socioeconomic History  . Marital status: Married  Spouse name: Edd Arbour  . Number of children: 2  . Years of education: 66  . Highest education level: Not on file  Occupational History  . Occupation: RetiredManufacturing engineer  Tobacco Use  . Smoking status: Never Smoker  . Smokeless tobacco: Never Used  Vaping Use  . Vaping Use: Never used  Substance and Sexual Activity  . Alcohol use: No  Alcohol/week: 0.0 standard drinks  . Drug use: Never  . Sexual activity: Defer  Partners: Male  Birth control/protection: None  Other Topics Concern  . Not on file  Social History Narrative  . Not on file   Social Determinants of Health   Financial Resource Strain: Not on file  Food Insecurity: Not on file  Transportation Needs: Not on file  Physical Activity: Not on file  Stress: Not on file  Social Connections: Not on file  Housing Stability: Not on file   Family History:  Family History  Problem Relation Age of Onset  . Lung cancer Father  . Heart failure Mother   Review of Systems:   A 10+ ROS was performed, reviewed, and the pertinent orthopaedic findings are documented in the HPI.   Physical Examination:   BP 110/70 (BP Location: Left upper arm, Patient Position: Sitting, BP Cuff Size: Adult)  Ht 162.6 cm (_0 )  Wt 87.1 kg (192 lb)  BMI 32.96 kg/m    Patient is a well-developed, well-nourished female in no acute distress. Patient has normal mood and affect. Patient is alert and oriented to person, place, and time.   HEENT: Atraumatic, normocephalic. Pupils equal and reactive to light. Extraocular motion intact. Noninjected sclera.  Cardiovascular: Regular rate and rhythm, with no murmurs, rubs, or gallops. Distal pulses palpable. No bruits.   Respiratory: Lungs clear to auscultation bilaterally.   Left Knee: Soft tissue swelling: minimal Effusion: none Erythema: none Crepitance: mild Tenderness: medial, anterior Alignment: relative varus Mediolateral laxity: medial pseudolaxity Posterior sag: negative Patellar tracking: Good tracking without evidence of subluxation or tilt Atrophy: Generalized quadriceps atrophy.  Quadriceps tone was fair to good. Range of motion: 0/3/120 degrees  Sensation intact over the saphenous, lateral sural cutaneous, superficial fibular, and deep fibular nerve distributions.  Tests Performed/Reviewed:  X-rays  3 views of the left knee were obtained. Images were moderate loss of medial compartment joint space. Moderate loss of patellofemoral joint space noted. Lateral compartment appears well to well-preserved. No fractures  or dislocations.  I personally ordered and interpreted today's radiographs.  Left knee MRI:  MRI OF THE LEFT KNEE WITHOUT CONTRAST   TECHNIQUE:  Multiplanar, multisequence MR imaging of the knee was performed. No  intravenous contrast was administered.   COMPARISON: None.   FINDINGS:  MENISCI   Medial meniscus: Degeneration and mild free edge fraying without  discrete radial tear or displaced meniscal fragment. The meniscal  root is intact.   Lateral meniscus: Intact with normal morphology.   LIGAMENTS   Cruciates: Intact.   Collaterals: Intact.   CARTILAGE   Patellofemoral: Moderate patellofemoral degenerative changes with  diffuse chondral thinning,  osteophytes and scattered subchondral  cyst formation.   Medial: Moderate chondral thinning and osteophyte formation.   Lateral: Mild chondral thinning and osteophyte formation.   MISCELLANEOUS   Joint: No significant joint effusion.   Popliteal Fossa: No significant typical Baker's cyst. There is a  septated ganglion posteromedial to the distal femur, superficial to  the joint capsule.   Extensor Mechanism: Intact.   Bones: No acute or significant extra-articular osseous findings.   Other: No other significant periarticular soft tissue findings.   IMPRESSION:  1. No acute findings or evidence of internal derangement. The  menisci, cruciate and collateral ligaments appear intact. The medial  meniscus demonstrates mild degeneration and free edge fraying.  2. Tricompartmental degenerative changes, greatest in the  patellofemoral compartment. No acute osseous findings.  3. Septated ganglion posteromedial to the distal femur.   Electronically Signed  By: Richardean Sale M.D.  On: 10/17/2019 13:48  Impression:   ICD-10-CM  1. Primary osteoarthritis of left knee M17.12  2. Severe obesity (BMI 35.0-39.9) with comorbidity (CMS-HCC) E66.01   stable per patient report and review of record. Followed by specialist. Continue follow-up as recommended.  Plan:   The patient has end-stage degenerative changes of the left knee. It was explained to the patient that the condition is progressive in nature. Having failed conservative treatment, the patient has elected to proceed with a total joint arthroplasty. The patient will undergo a total joint arthroplasty with Dr. Marry Guan. The risks of surgery, including blood clot and infection, were discussed with the patient. Measures to reduce these risks, including the use of anticoagulation, perioperative antibiotics, and early ambulation were discussed. The importance of postoperative physical therapy was discussed with the patient. The  patient elects to proceed with surgery. The patient is instructed to stop all blood thinners prior to surgery. The patient is instructed to call the hospital the day before surgery to learn of the proper arrival time.   Contact our office with any questions or concerns. Follow up as indicated, or sooner should any new problems arise, if conditions worsen, or if they are otherwise concerned.   Gwenlyn Fudge, PA-C [facility] [address] [place], [place] [address] Phone: [phone]  This note was generated in part with voice recognition software and I apologize for any typographical errors that were not detected and corrected.   Electronically signed by Gwenlyn Fudge, PA at 04/21/2020 6:02 PM EST

## 2020-04-26 ENCOUNTER — Inpatient Hospital Stay: Payer: Medicare Other | Admitting: Certified Registered Nurse Anesthetist

## 2020-04-26 ENCOUNTER — Encounter
Admission: RE | Disposition: A | Discharge status: Home / Self Care | Payer: Self-pay | Source: Home / Self Care | Attending: Orthopedic Surgery

## 2020-04-26 ENCOUNTER — Inpatient Hospital Stay
Admission: RE | Admit: 2020-04-26 | Discharge: 2020-04-27 | DRG: 470 | Disposition: A | Discharge status: Home / Self Care | Payer: Medicare Other | Attending: Orthopedic Surgery | Admitting: Orthopedic Surgery

## 2020-04-26 ENCOUNTER — Encounter: Payer: Self-pay | Admitting: Orthopedic Surgery

## 2020-04-26 ENCOUNTER — Inpatient Hospital Stay: Payer: Medicare Other

## 2020-04-26 ENCOUNTER — Other Ambulatory Visit: Payer: Self-pay

## 2020-04-26 DIAGNOSIS — K760 Fatty (change of) liver, not elsewhere classified: Secondary | ICD-10-CM | POA: Diagnosis present

## 2020-04-26 DIAGNOSIS — M25762 Osteophyte, left knee: Secondary | ICD-10-CM | POA: Diagnosis present

## 2020-04-26 DIAGNOSIS — E559 Vitamin D deficiency, unspecified: Secondary | ICD-10-CM | POA: Diagnosis present

## 2020-04-26 DIAGNOSIS — I10 Essential (primary) hypertension: Secondary | ICD-10-CM | POA: Diagnosis present

## 2020-04-26 DIAGNOSIS — Z79890 Hormone replacement therapy (postmenopausal): Secondary | ICD-10-CM | POA: Diagnosis not present

## 2020-04-26 DIAGNOSIS — Z885 Allergy status to narcotic agent status: Secondary | ICD-10-CM

## 2020-04-26 DIAGNOSIS — E785 Hyperlipidemia, unspecified: Secondary | ICD-10-CM | POA: Diagnosis present

## 2020-04-26 DIAGNOSIS — Z90710 Acquired absence of both cervix and uterus: Secondary | ICD-10-CM | POA: Diagnosis not present

## 2020-04-26 DIAGNOSIS — Z7984 Long term (current) use of oral hypoglycemic drugs: Secondary | ICD-10-CM | POA: Diagnosis not present

## 2020-04-26 DIAGNOSIS — M6748 Ganglion, other site: Secondary | ICD-10-CM | POA: Diagnosis present

## 2020-04-26 DIAGNOSIS — Z8249 Family history of ischemic heart disease and other diseases of the circulatory system: Secondary | ICD-10-CM | POA: Diagnosis not present

## 2020-04-26 DIAGNOSIS — Z880 Allergy status to penicillin: Secondary | ICD-10-CM | POA: Diagnosis not present

## 2020-04-26 DIAGNOSIS — M23307 Other meniscus derangements, unspecified meniscus, left knee: Secondary | ICD-10-CM | POA: Diagnosis present

## 2020-04-26 DIAGNOSIS — K219 Gastro-esophageal reflux disease without esophagitis: Secondary | ICD-10-CM | POA: Diagnosis present

## 2020-04-26 DIAGNOSIS — M1712 Unilateral primary osteoarthritis, left knee: Principal | ICD-10-CM | POA: Diagnosis present

## 2020-04-26 DIAGNOSIS — E1165 Type 2 diabetes mellitus with hyperglycemia: Secondary | ICD-10-CM | POA: Diagnosis present

## 2020-04-26 DIAGNOSIS — Z79899 Other long term (current) drug therapy: Secondary | ICD-10-CM

## 2020-04-26 DIAGNOSIS — Z96659 Presence of unspecified artificial knee joint: Secondary | ICD-10-CM

## 2020-04-26 HISTORY — PX: KNEE ARTHROPLASTY: SHX992

## 2020-04-26 LAB — GLUCOSE, CAPILLARY
Glucose-Capillary: 150 mg/dL — ABNORMAL HIGH (ref 70–99)
Glucose-Capillary: 160 mg/dL — ABNORMAL HIGH (ref 70–99)
Glucose-Capillary: 185 mg/dL — ABNORMAL HIGH (ref 70–99)
Glucose-Capillary: 173 mg/dL — ABNORMAL HIGH (ref 70–99)

## 2020-04-26 LAB — ABO/RH: ABO/RH(D): O NEG

## 2020-04-26 SURGERY — ARTHROPLASTY, KNEE, TOTAL, USING IMAGELESS COMPUTER-ASSISTED NAVIGATION
Anesthesia: Spinal | Site: Knee | Laterality: Left

## 2020-04-26 MED ORDER — GABAPENTIN 300 MG PO CAPS: 300.0000 mg | ORAL_CAPSULE | Freq: Once | ORAL | Status: AC

## 2020-04-26 MED ORDER — FLEET ENEMA 7-19 GM/118ML RE ENEM: 1.0000 | ENEMA | Freq: Once | RECTAL | Status: DC | PRN

## 2020-04-26 MED ORDER — DEXAMETHASONE SODIUM PHOSPHATE 10 MG/ML IJ SOLN
INTRAMUSCULAR | Status: AC
  Administered 2020-04-26: 8 mg via INTRAVENOUS
  Filled 2020-04-26: qty 1

## 2020-04-26 MED ORDER — DIPHENHYDRAMINE HCL 12.5 MG/5ML PO ELIX: 12.5000 mg | ORAL_SOLUTION | ORAL | Status: DC | PRN

## 2020-04-26 MED ORDER — ONDANSETRON HCL 4 MG PO TABS: 4.0000 mg | ORAL_TABLET | Freq: Four times a day (QID) | ORAL | Status: DC | PRN

## 2020-04-26 MED ORDER — DEXAMETHASONE SODIUM PHOSPHATE 10 MG/ML IJ SOLN: 8.0000 mg | Freq: Once | INTRAMUSCULAR | Status: AC

## 2020-04-26 MED ORDER — HYDROCHLOROTHIAZIDE 12.5 MG PO CAPS
12.5000 mg | ORAL_CAPSULE | Freq: Every day | ORAL | Status: DC
  Administered 2020-04-27: 12.5 mg via ORAL
  Filled 2020-04-26: qty 1

## 2020-04-26 MED ORDER — CELECOXIB 200 MG PO CAPS
200.0000 mg | ORAL_CAPSULE | Freq: Two times a day (BID) | ORAL | Status: DC
  Administered 2020-04-26 – 2020-04-27 (×2): 200 mg via ORAL
  Filled 2020-04-26 (×2): qty 1

## 2020-04-26 MED ORDER — LISINOPRIL-HYDROCHLOROTHIAZIDE 20-12.5 MG PO TABS: 1.0000 | ORAL_TABLET | Freq: Every day | ORAL | Status: DC

## 2020-04-26 MED ORDER — TRAMADOL HCL 50 MG PO TABS: 50.0000 mg | ORAL_TABLET | ORAL | Status: DC | PRN

## 2020-04-26 MED ORDER — PROPOFOL 500 MG/50ML IV EMUL
INTRAVENOUS | Status: AC
  Filled 2020-04-26: qty 50

## 2020-04-26 MED ORDER — CHLORHEXIDINE GLUCONATE 0.12 % MT SOLN: 15.0000 mL | Freq: Once | OROMUCOSAL | Status: AC

## 2020-04-26 MED ORDER — CHLORHEXIDINE GLUCONATE 0.12 % MT SOLN
OROMUCOSAL | Status: AC
  Administered 2020-04-26: 15 mL via OROMUCOSAL
  Filled 2020-04-26: qty 15

## 2020-04-26 MED ORDER — BUPIVACAINE HCL (PF) 0.5 % IJ SOLN
INTRAMUSCULAR | Status: DC | PRN
  Administered 2020-04-26: 3 mL

## 2020-04-26 MED ORDER — TRANEXAMIC ACID-NACL 1000-0.7 MG/100ML-% IV SOLN
INTRAVENOUS | Status: AC
  Filled 2020-04-26: qty 100

## 2020-04-26 MED ORDER — FAMOTIDINE 20 MG PO TABS
ORAL_TABLET | ORAL | Status: AC
  Administered 2020-04-26: 20 mg via ORAL
  Filled 2020-04-26: qty 1

## 2020-04-26 MED ORDER — CALCIUM CARBONATE-VITAMIN D 500-200 MG-UNIT PO TABS
1.0000 | ORAL_TABLET | Freq: Every day | ORAL | Status: DC
  Administered 2020-04-27: 1 via ORAL
  Filled 2020-04-26: qty 1

## 2020-04-26 MED ORDER — MENTHOL 3 MG MT LOZG
1.0000 | LOZENGE | OROMUCOSAL | Status: DC | PRN
  Filled 2020-04-26: qty 9

## 2020-04-26 MED ORDER — ADULT MULTIVITAMIN W/MINERALS CH
1.0000 | ORAL_TABLET | Freq: Every day | ORAL | Status: DC
  Administered 2020-04-27: 1 via ORAL
  Filled 2020-04-26: qty 1

## 2020-04-26 MED ORDER — ONDANSETRON HCL 4 MG/2ML IJ SOLN: 4.0000 mg | Freq: Four times a day (QID) | INTRAMUSCULAR | Status: DC | PRN

## 2020-04-26 MED ORDER — SODIUM CHLORIDE 0.9 % IV SOLN
INTRAVENOUS | Status: DC | PRN
  Administered 2020-04-26: 60 mL

## 2020-04-26 MED ORDER — TRANEXAMIC ACID-NACL 1000-0.7 MG/100ML-% IV SOLN
INTRAVENOUS | Status: AC
  Administered 2020-04-26: 1000 mg via INTRAVENOUS
  Filled 2020-04-26: qty 100

## 2020-04-26 MED ORDER — NEOMYCIN-POLYMYXIN B GU 40-200000 IR SOLN
Status: DC | PRN
  Administered 2020-04-26: 16 mL

## 2020-04-26 MED ORDER — MIDAZOLAM HCL 2 MG/2ML IJ SOLN
INTRAMUSCULAR | Status: AC
  Filled 2020-04-26: qty 2

## 2020-04-26 MED ORDER — LISINOPRIL 20 MG PO TABS
20.0000 mg | ORAL_TABLET | Freq: Every day | ORAL | Status: DC
  Administered 2020-04-27: 20 mg via ORAL
  Filled 2020-04-26: qty 1

## 2020-04-26 MED ORDER — SENNOSIDES-DOCUSATE SODIUM 8.6-50 MG PO TABS
1.0000 | ORAL_TABLET | Freq: Two times a day (BID) | ORAL | Status: DC
  Administered 2020-04-26 – 2020-04-27 (×2): 1 via ORAL
  Filled 2020-04-26 (×2): qty 1

## 2020-04-26 MED ORDER — SURGIPHOR WOUND IRRIGATION SYSTEM - OPTIME
TOPICAL | Status: DC | PRN
  Administered 2020-04-26: 450 mL via TOPICAL

## 2020-04-26 MED ORDER — SODIUM CHLORIDE 0.9 % IV SOLN
INTRAVENOUS | Status: DC | PRN
  Administered 2020-04-26: 25 ug/min via INTRAVENOUS

## 2020-04-26 MED ORDER — MULTIVITAMINS PO CAPS: 1.0000 | ORAL_CAPSULE | Freq: Every day | ORAL | Status: DC

## 2020-04-26 MED ORDER — ORAL CARE MOUTH RINSE: 15.0000 mL | Freq: Once | OROMUCOSAL | Status: AC

## 2020-04-26 MED ORDER — BUPIVACAINE HCL (PF) 0.25 % IJ SOLN
INTRAMUSCULAR | Status: DC | PRN
  Administered 2020-04-26: 60 mL

## 2020-04-26 MED ORDER — FAMOTIDINE 20 MG PO TABS: 20.0000 mg | ORAL_TABLET | Freq: Once | ORAL | Status: AC

## 2020-04-26 MED ORDER — CEFAZOLIN SODIUM-DEXTROSE 2-4 GM/100ML-% IV SOLN
2.0000 g | Freq: Four times a day (QID) | INTRAVENOUS | Status: AC
  Administered 2020-04-27 (×2): 2 g via INTRAVENOUS
  Filled 2020-04-26 (×2): qty 100

## 2020-04-26 MED ORDER — CHLORHEXIDINE GLUCONATE 4 % EX LIQD: 60.0000 mL | Freq: Once | CUTANEOUS | Status: DC

## 2020-04-26 MED ORDER — BISACODYL 10 MG RE SUPP: 10.0000 mg | Freq: Every day | RECTAL | Status: DC | PRN

## 2020-04-26 MED ORDER — CELECOXIB 200 MG PO CAPS: 400.0000 mg | ORAL_CAPSULE | Freq: Once | ORAL | Status: AC

## 2020-04-26 MED ORDER — ALUM & MAG HYDROXIDE-SIMETH 200-200-20 MG/5ML PO SUSP: 30.0000 mL | ORAL | Status: DC | PRN

## 2020-04-26 MED ORDER — ACETAMINOPHEN 10 MG/ML IV SOLN
1000.0000 mg | Freq: Four times a day (QID) | INTRAVENOUS | Status: AC
  Administered 2020-04-26 – 2020-04-27 (×4): 1000 mg via INTRAVENOUS
  Filled 2020-04-26 (×4): qty 100

## 2020-04-26 MED ORDER — TRANEXAMIC ACID-NACL 1000-0.7 MG/100ML-% IV SOLN: 1000.0000 mg | Freq: Once | INTRAVENOUS | Status: AC

## 2020-04-26 MED ORDER — METFORMIN HCL 500 MG PO TABS
500.0000 mg | ORAL_TABLET | Freq: Two times a day (BID) | ORAL | Status: DC
  Administered 2020-04-26 – 2020-04-27 (×3): 500 mg via ORAL
  Filled 2020-04-26 (×3): qty 1

## 2020-04-26 MED ORDER — CELECOXIB 200 MG PO CAPS
ORAL_CAPSULE | ORAL | Status: AC
  Administered 2020-04-26: 400 mg via ORAL
  Filled 2020-04-26: qty 2

## 2020-04-26 MED ORDER — SODIUM CHLORIDE 0.9 % IV SOLN: INTRAVENOUS | Status: DC

## 2020-04-26 MED ORDER — GABAPENTIN 300 MG PO CAPS
ORAL_CAPSULE | ORAL | Status: AC
  Administered 2020-04-26: 300 mg via ORAL
  Filled 2020-04-26: qty 1

## 2020-04-26 MED ORDER — OXYCODONE HCL 5 MG PO TABS
10.0000 mg | ORAL_TABLET | ORAL | Status: DC | PRN
  Administered 2020-04-26 – 2020-04-27 (×3): 10 mg via ORAL
  Filled 2020-04-26 (×3): qty 2

## 2020-04-26 MED ORDER — CEFAZOLIN SODIUM-DEXTROSE 2-4 GM/100ML-% IV SOLN
INTRAVENOUS | Status: AC
  Filled 2020-04-26: qty 100

## 2020-04-26 MED ORDER — TRAMADOL HCL 50 MG PO TABS
ORAL_TABLET | ORAL | Status: AC
  Administered 2020-04-26: 50 mg via ORAL
  Filled 2020-04-26: qty 1

## 2020-04-26 MED ORDER — PHENOL 1.4 % MT LIQD
1.0000 | OROMUCOSAL | Status: DC | PRN
  Filled 2020-04-26: qty 177

## 2020-04-26 MED ORDER — ONDANSETRON HCL 4 MG/2ML IJ SOLN
4.0000 mg | Freq: Once | INTRAMUSCULAR | Status: AC | PRN
  Administered 2020-04-26: 4 mg via INTRAVENOUS

## 2020-04-26 MED ORDER — ACETAMINOPHEN 10 MG/ML IV SOLN
INTRAVENOUS | Status: AC
  Filled 2020-04-26: qty 100

## 2020-04-26 MED ORDER — CEFAZOLIN SODIUM-DEXTROSE 2-4 GM/100ML-% IV SOLN
2.0000 g | INTRAVENOUS | Status: AC
  Administered 2020-04-26: 2 g via INTRAVENOUS

## 2020-04-26 MED ORDER — FENTANYL CITRATE (PF) 100 MCG/2ML IJ SOLN
INTRAMUSCULAR | Status: AC
  Filled 2020-04-26: qty 2

## 2020-04-26 MED ORDER — LIDOCAINE HCL (PF) 2 % IJ SOLN
INTRAMUSCULAR | Status: AC
  Filled 2020-04-26: qty 5

## 2020-04-26 MED ORDER — FENTANYL CITRATE (PF) 100 MCG/2ML IJ SOLN
INTRAMUSCULAR | Status: DC | PRN
  Administered 2020-04-26: 50 ug via INTRAVENOUS

## 2020-04-26 MED ORDER — METOCLOPRAMIDE HCL 10 MG PO TABS
10.0000 mg | ORAL_TABLET | Freq: Three times a day (TID) | ORAL | Status: DC
  Administered 2020-04-26 – 2020-04-27 (×4): 10 mg via ORAL
  Filled 2020-04-26 (×4): qty 1

## 2020-04-26 MED ORDER — MAGNESIUM HYDROXIDE 400 MG/5ML PO SUSP
30.0000 mL | Freq: Every day | ORAL | Status: DC
  Administered 2020-04-27: 30 mL via ORAL
  Filled 2020-04-26: qty 30

## 2020-04-26 MED ORDER — HYDROMORPHONE HCL 1 MG/ML IJ SOLN: 0.5000 mg | INTRAMUSCULAR | Status: DC | PRN

## 2020-04-26 MED ORDER — HYOSCYAMINE SULFATE 0.125 MG SL SUBL
0.1250 mg | SUBLINGUAL_TABLET | SUBLINGUAL | Status: DC | PRN
  Filled 2020-04-26: qty 1

## 2020-04-26 MED ORDER — FERROUS SULFATE 325 (65 FE) MG PO TABS
325.0000 mg | ORAL_TABLET | Freq: Two times a day (BID) | ORAL | Status: DC
  Administered 2020-04-27 (×2): 325 mg via ORAL
  Filled 2020-04-26 (×2): qty 1

## 2020-04-26 MED ORDER — ATORVASTATIN CALCIUM 20 MG PO TABS
40.0000 mg | ORAL_TABLET | Freq: Every day | ORAL | Status: DC
  Administered 2020-04-27: 40 mg via ORAL
  Filled 2020-04-26: qty 2

## 2020-04-26 MED ORDER — FENTANYL CITRATE (PF) 100 MCG/2ML IJ SOLN: 25.0000 ug | INTRAMUSCULAR | Status: DC | PRN

## 2020-04-26 MED ORDER — ACETAMINOPHEN 325 MG PO TABS: 325.0000 mg | ORAL_TABLET | Freq: Four times a day (QID) | ORAL | Status: DC | PRN

## 2020-04-26 MED ORDER — ENOXAPARIN SODIUM 60 MG/0.6ML ~~LOC~~ SOLN
0.5000 mg/kg | Freq: Two times a day (BID) | SUBCUTANEOUS | Status: DC
  Administered 2020-04-27: 45 mg via SUBCUTANEOUS
  Filled 2020-04-26: qty 0.6

## 2020-04-26 MED ORDER — TRANEXAMIC ACID-NACL 1000-0.7 MG/100ML-% IV SOLN
1000.0000 mg | INTRAVENOUS | Status: AC
  Administered 2020-04-26: 1000 mg via INTRAVENOUS

## 2020-04-26 MED ORDER — INSULIN ASPART 100 UNIT/ML ~~LOC~~ SOLN
0.0000 [IU] | Freq: Three times a day (TID) | SUBCUTANEOUS | Status: DC
  Administered 2020-04-26: 3 [IU] via SUBCUTANEOUS
  Filled 2020-04-26: qty 1

## 2020-04-26 MED ORDER — PANTOPRAZOLE SODIUM 40 MG PO TBEC
40.0000 mg | DELAYED_RELEASE_TABLET | Freq: Two times a day (BID) | ORAL | Status: DC
  Administered 2020-04-26 – 2020-04-27 (×2): 40 mg via ORAL
  Filled 2020-04-26 (×2): qty 1

## 2020-04-26 MED ORDER — OXYCODONE HCL 5 MG PO TABS: 5.0000 mg | ORAL_TABLET | ORAL | Status: DC | PRN

## 2020-04-26 MED ORDER — VITAMIN D 25 MCG (1000 UNIT) PO TABS
1000.0000 [IU] | ORAL_TABLET | Freq: Every day | ORAL | Status: DC
  Administered 2020-04-27: 1000 [IU] via ORAL
  Filled 2020-04-26: qty 1

## 2020-04-26 MED ORDER — ENOXAPARIN SODIUM 40 MG/0.4ML ~~LOC~~ SOLN: 40.0000 mg | Freq: Two times a day (BID) | SUBCUTANEOUS | Status: DC

## 2020-04-26 MED ORDER — ONDANSETRON HCL 4 MG/2ML IJ SOLN
INTRAMUSCULAR | Status: AC
  Filled 2020-04-26: qty 2

## 2020-04-26 MED ORDER — PROPOFOL 500 MG/50ML IV EMUL
INTRAVENOUS | Status: DC | PRN
  Administered 2020-04-26: 100 ug/kg/min via INTRAVENOUS

## 2020-04-26 MED ORDER — MIDAZOLAM HCL 5 MG/5ML IJ SOLN
INTRAMUSCULAR | Status: DC | PRN
  Administered 2020-04-26: 1 mg via INTRAVENOUS

## 2020-04-26 SURGICAL SUPPLY — 76 items
ATTUNE MED DOME PAT 38 KNEE (Knees) ×1 IMPLANT
ATTUNE PS FEM LT SZ 7 CEM KNEE (Femur) ×1 IMPLANT
ATTUNE PSRP INSR SZ7 7 KNEE (Insert) ×1 IMPLANT
BASE TIBIA ATTUNE KNEE SYS SZ6 (Knees) IMPLANT
BATTERY INSTRU NAVIGATION (MISCELLANEOUS) ×8 IMPLANT
BLADE SAW 70X12.5 (BLADE) ×2 IMPLANT
BLADE SAW 90X13X1.19 OSCILLAT (BLADE) ×2 IMPLANT
BLADE SAW 90X25X1.19 OSCILLAT (BLADE) ×2 IMPLANT
BONE CEMENT GENTAMICIN (Cement) ×4 IMPLANT
CANISTER PREVENA PLUS 150 (CANNISTER) ×2 IMPLANT
CANISTER SUCT 3000ML PPV (MISCELLANEOUS) ×2 IMPLANT
CEMENT BONE GENTAMICIN 40 (Cement) IMPLANT
COOLER POLAR GLACIER W/PUMP (MISCELLANEOUS) ×2 IMPLANT
COVER WAND RF STERILE (DRAPES) ×2 IMPLANT
CUFF TOURN SGL QUICK 24 (TOURNIQUET CUFF) ×1
CUFF TRNQT CYL 24X4X16.5-23 (TOURNIQUET CUFF) IMPLANT
DRAPE 3/4 80X56 (DRAPES) ×2 IMPLANT
DRESSING PEEL AND PLAC PRVNA20 (GAUZE/BANDAGES/DRESSINGS) ×1 IMPLANT
DRSG DERMACEA 8X12 NADH (GAUZE/BANDAGES/DRESSINGS) ×2 IMPLANT
DRSG MEPILEX SACRM 8.7X9.8 (GAUZE/BANDAGES/DRESSINGS) ×2 IMPLANT
DRSG OPSITE POSTOP 4X14 (GAUZE/BANDAGES/DRESSINGS) ×2 IMPLANT
DRSG PEEL AND PLACE PREVENA 20 (GAUZE/BANDAGES/DRESSINGS) ×2
DRSG TEGADERM 4X4.75 (GAUZE/BANDAGES/DRESSINGS) ×2 IMPLANT
DURAPREP 26ML APPLICATOR (WOUND CARE) ×4 IMPLANT
ELECT REM PT RETURN 9FT ADLT (ELECTROSURGICAL) ×2
ELECTRODE REM PT RTRN 9FT ADLT (ELECTROSURGICAL) ×1 IMPLANT
EX-PIN ORTHOLOCK NAV 4X150 (PIN) ×4 IMPLANT
GLOVE INDICATOR 8.0 STRL GRN (GLOVE) ×2 IMPLANT
GLOVE SURG ENC MOIS LTX SZ7.5 (GLOVE) ×4 IMPLANT
GLOVE SURG ENC TEXT LTX SZ7.5 (GLOVE) ×4 IMPLANT
GLOVE SURG UNDER POLY LF SZ7.5 (GLOVE) ×2 IMPLANT
GOWN STRL REUS W/ TWL LRG LVL3 (GOWN DISPOSABLE) ×2 IMPLANT
GOWN STRL REUS W/ TWL XL LVL3 (GOWN DISPOSABLE) ×1 IMPLANT
GOWN STRL REUS W/TWL LRG LVL3 (GOWN DISPOSABLE) ×2
GOWN STRL REUS W/TWL XL LVL3 (GOWN DISPOSABLE) ×1
HEMOVAC 400CC 10FR (MISCELLANEOUS) ×2 IMPLANT
HOLDER FOLEY CATH W/STRAP (MISCELLANEOUS) ×2 IMPLANT
HOOD PEEL AWAY FLYTE STAYCOOL (MISCELLANEOUS) ×4 IMPLANT
IRRIGATION SURGIPHOR STRL (IV SOLUTION) ×2 IMPLANT
KIT PUMP PREVENA PLUS 14DAY (MISCELLANEOUS) ×2 IMPLANT
KIT TURNOVER KIT A (KITS) ×2 IMPLANT
KNIFE SCULPS 14X20 (INSTRUMENTS) ×2 IMPLANT
LABEL OR SOLS (LABEL) ×2 IMPLANT
MANIFOLD NEPTUNE II (INSTRUMENTS) ×4 IMPLANT
NDL SAFETY ECLIPSE 18X1.5 (NEEDLE) ×1 IMPLANT
NDL SPNL 20GX3.5 QUINCKE YW (NEEDLE) ×2 IMPLANT
NEEDLE HYPO 18GX1.5 SHARP (NEEDLE) ×1
NEEDLE SPNL 20GX3.5 QUINCKE YW (NEEDLE) ×4 IMPLANT
NS IRRIG 500ML POUR BTL (IV SOLUTION) ×2 IMPLANT
PACK TOTAL KNEE (MISCELLANEOUS) ×2 IMPLANT
PAD WRAPON POLAR KNEE (MISCELLANEOUS) ×1 IMPLANT
PENCIL SMOKE EVACUATOR (MISCELLANEOUS) ×2 IMPLANT
PIN DRILL QUICK PACK ×2 IMPLANT
PIN FIXATION 1/8DIA X 3INL (PIN) ×6 IMPLANT
PULSAVAC PLUS IRRIG FAN TIP (DISPOSABLE) ×2
SOL .9 NS 3000ML IRR  AL (IV SOLUTION) ×1
SOL .9 NS 3000ML IRR UROMATIC (IV SOLUTION) ×1 IMPLANT
SOL PREP PVP 2OZ (MISCELLANEOUS) ×2
SOLUTION PREP PVP 2OZ (MISCELLANEOUS) ×1 IMPLANT
SPONGE DRAIN TRACH 4X4 STRL 2S (GAUZE/BANDAGES/DRESSINGS) ×2 IMPLANT
STAPLER SKIN PROX 35W (STAPLE) ×2 IMPLANT
STOCKINETTE IMPERV 14X48 (MISCELLANEOUS) ×1 IMPLANT
STRAP TIBIA SHORT (MISCELLANEOUS) ×2 IMPLANT
SUCTION FRAZIER HANDLE 10FR (MISCELLANEOUS) ×1
SUCTION TUBE FRAZIER 10FR DISP (MISCELLANEOUS) ×1 IMPLANT
SUT VIC AB 0 CT1 36 (SUTURE) ×4 IMPLANT
SUT VIC AB 1 CT1 36 (SUTURE) ×4 IMPLANT
SUT VIC AB 2-0 CT2 27 (SUTURE) ×2 IMPLANT
SYR 20ML LL LF (SYRINGE) ×2 IMPLANT
SYR 30ML LL (SYRINGE) ×4 IMPLANT
TIBIA ATTUNE KNEE SYS BASE SZ6 (Knees) ×2 IMPLANT
TIP FAN IRRIG PULSAVAC PLUS (DISPOSABLE) ×1 IMPLANT
TOWEL OR 17X26 4PK STRL BLUE (TOWEL DISPOSABLE) ×2 IMPLANT
TOWER CARTRIDGE SMART MIX (DISPOSABLE) ×2 IMPLANT
TRAY FOLEY MTR SLVR 16FR STAT (SET/KITS/TRAYS/PACK) ×2 IMPLANT
WRAPON POLAR PAD KNEE (MISCELLANEOUS) ×2

## 2020-04-26 NOTE — Plan of Care (Signed)

## 2020-04-26 NOTE — Anesthesia Procedure Notes (Signed)
Spinal  Patient location during procedure: OR Start time: 04/26/2020 11:39 AM End time: 04/26/2020 11:47 AM Staffing Performed: resident/CRNA  Anesthesiologist: Molli Barrows, MD Resident/CRNA: Johnna Acosta, CRNA Preanesthetic Checklist Completed: patient identified, IV checked, site marked, risks and benefits discussed, surgical consent, monitors and equipment checked, pre-op evaluation and timeout performed Spinal Block Patient position: sitting Prep: ChloraPrep Patient monitoring: heart rate, continuous pulse ox, blood pressure and cardiac monitor Approach: midline Location: L3-4 Injection technique: single-shot Needle Needle type: Whitacre and Introducer  Needle gauge: 24 G Needle length: 9 cm Assessment Sensory level: T10 Additional Notes Negative paresthesia. Negative blood return. Positive free-flowing CSF. Expiration date of kit checked and confirmed. Patient tolerated procedure well, without complications.

## 2020-04-26 NOTE — Anesthesia Procedure Notes (Signed)
Date/Time: 04/26/2020 11:38 AM Performed by: Ginger Carne, CRNA Pre-anesthesia Checklist: Patient identified, Emergency Drugs available, Suction available, Patient being monitored and Timeout performed Patient Re-evaluated:Patient Re-evaluated prior to induction Oxygen Delivery Method: Simple face mask Preoxygenation: Pre-oxygenation with 100% oxygen Induction Type: IV induction

## 2020-04-26 NOTE — Op Note (Signed)
OPERATIVE NOTE  DATE OF SURGERY:  04/26/2020  PATIENT NAME:  Brianna Dudley   DOB: 1945/03/08  MRN: [id number]  PRE-OPERATIVE DIAGNOSIS: Degenerative arthrosis of the left knee, primary  POST-OPERATIVE DIAGNOSIS:  Same  PROCEDURE:  Left total knee arthroplasty using computer-assisted navigation  SURGEON:  Jena Gauss. M.D.  ASSISTANT: Baldwin Jamaica, PA-C (present and scrubbed throughout the case, critical for assistance with exposure, retraction, instrumentation, and closure)  ANESTHESIA: spinal  ESTIMATED BLOOD LOSS: 50 mL  FLUIDS REPLACED: 700 mL of crystalloid  TOURNIQUET TIME: 85 minutes  DRAINS: 2 medium Hemovac  SOFT TISSUE RELEASES: Anterior cruciate ligament, posterior cruciate ligament, deep medial collateral ligament, patellofemoral ligament  IMPLANTS UTILIZED: DePuy Attune size 7 posterior stabilized femoral component (cemented), size 6 rotating platform tibial component (cemented), 38 mm medialized dome patella (cemented), and a 7 mm stabilized rotating platform polyethylene insert.  INDICATIONS FOR SURGERY: Brianna Dudley is a 75 y.o. year old female with a long history of progressive knee pain. X-rays demonstrated severe degenerative changes in tricompartmental fashion. The patient had not seen any significant improvement despite conservative nonsurgical intervention. After discussion of the risks and benefits of surgical intervention, the patient expressed understanding of the risks benefits and agree with plans for total knee arthroplasty.   The risks, benefits, and alternatives were discussed at length including but not limited to the risks of infection, bleeding, nerve injury, stiffness, blood clots, the need for revision surgery, cardiopulmonary complications, among others, and they were willing to proceed.  PROCEDURE IN DETAIL: The patient was brought into the operating room and, after adequate spinal anesthesia was achieved, a tourniquet was placed on the  patient's upper thigh. The patient's knee and leg were cleaned and prepped with alcohol and DuraPrep and draped in the usual sterile fashion. A "timeout" was performed as per usual protocol. The lower extremity was exsanguinated using an Esmarch, and the tourniquet was inflated to 300 mmHg. An anterior longitudinal incision was made followed by a standard mid vastus approach. The deep fibers of the medial collateral ligament were elevated in a subperiosteal fashion off of the medial flare of the tibia so as to maintain a continuous soft tissue sleeve. The patella was subluxed laterally and the patellofemoral ligament was incised. Inspection of the knee demonstrated severe degenerative changes with full-thickness loss of articular cartilage. Osteophytes were debrided using a rongeur. Anterior and posterior cruciate ligaments were excised. Two 4.0 mm Schanz pins were inserted in the femur and into the tibia for attachment of the array of trackers used for computer-assisted navigation. Hip center was identified using a circumduction technique. Distal landmarks were mapped using the computer. The distal femur and proximal tibia were mapped using the computer. The distal femoral cutting guide was positioned using computer-assisted navigation so as to achieve a 5 distal valgus cut. The femur was sized and it was felt that a size 7 femoral component was appropriate. A size 7 femoral cutting guide was positioned and the anterior cut was performed and verified using the computer. This was followed by completion of the posterior and chamfer cuts. Femoral cutting guide for the central box was then positioned in the center box cut was performed.  Attention was then directed to the proximal tibia. Medial and lateral menisci were excised. The extramedullary tibial cutting guide was positioned using computer-assisted navigation so as to achieve a 0 varus-valgus alignment and 3 posterior slope. The cut was performed and  verified using the computer. The proximal tibia was sized  and it was felt that a size 6 tibial tray was appropriate. Tibial and femoral trials were inserted followed by insertion of a 7 mm polyethylene insert. This allowed for excellent mediolateral soft tissue balancing both in flexion and in full extension. Finally, the patella was cut and prepared so as to accommodate a 38 mm medialized dome patella. A patella trial was placed and the knee was placed through a range of motion with excellent patellar tracking appreciated. The femoral trial was removed after debridement of posterior osteophytes. The central post-hole for the tibial component was reamed followed by insertion of a keel punch. Tibial trials were then removed. Cut surfaces of bone were irrigated with copious amounts of normal saline using pulsatile lavage and then suctioned dry. Polymethylmethacrylate cement with gentamicin was prepared in the usual fashion using a vacuum mixer. Cement was applied to the cut surface of the proximal tibia as well as along the undersurface of a size 6 rotating platform tibial component. Tibial component was positioned and impacted into place. Excess cement was removed using Personal assistant. Cement was then applied to the cut surfaces of the femur as well as along the posterior flanges of the size 7 femoral component. The femoral component was positioned and impacted into place. Excess cement was removed using Personal assistant. A 7 mm polyethylene trial was inserted and the knee was brought into full extension with steady axial compression applied. Finally, cement was applied to the backside of a 38 mm medialized dome patella and the patellar component was positioned and patellar clamp applied. Excess cement was removed using Personal assistant. After adequate curing of the cement, the tourniquet was deflated after a total tourniquet time of 85 minutes. Hemostasis was achieved using electrocautery. The knee was irrigated with  copious amounts of normal saline using pulsatile lavage followed by 500 ml of Surgiphor and then suctioned dry. 20 mL of 1.3% Exparel and 60 mL of 0.25% Marcaine in 40 mL of normal saline was injected along the posterior capsule, medial and lateral gutters, and along the arthrotomy site. A 7 mm stabilized rotating platform polyethylene insert was inserted and the knee was placed through a range of motion with excellent mediolateral soft tissue balancing appreciated and excellent patellar tracking noted. 2 medium drains were placed in the wound bed and brought out through separate stab incisions. The medial parapatellar portion of the incision was reapproximated using interrupted sutures of #1 Vicryl. Subcutaneous tissue was approximated in layers using first #0 Vicryl followed #2-0 Vicryl. The skin was approximated with skin staples. A sterile dressing was applied.  The patient tolerated the procedure well and was transported to the recovery room in stable condition.    Kimberlly Norgard P. Angie Fava., M.D.

## 2020-04-26 NOTE — H&P (Signed)
The patient has been re-examined, and the chart reviewed, and there have been no interval changes to the documented history and physical.    The risks, benefits, and alternatives have been discussed at length. The patient expressed understanding of the risks benefits and agreed with plans for surgical intervention.  Lynne Righi P. Davon Abdelaziz, Jr. M.D.    

## 2020-04-26 NOTE — Transfer of Care (Signed)
Immediate Anesthesia Transfer of Care Note  Patient: Brianna Dudley  Procedure(s) Performed: COMPUTER ASSISTED TOTAL KNEE ARTHROPLASTY (Left Knee)  Patient Location: PACU  Anesthesia Type:Spinal  Level of Consciousness: awake, alert  and oriented  Airway & Oxygen Therapy: Patient Spontanous Breathing and Patient connected to face mask oxygen  Post-op Assessment: Report given to RN and Post -op Vital signs reviewed and stable  Post vital signs: Reviewed and stable  Last Vitals:  Vitals Value Taken Time  BP    Temp    Pulse 99 04/26/20 1457  Resp 19 04/26/20 1457  SpO2 97 % 04/26/20 1457  Vitals shown include unvalidated device data.  Last Pain:  Vitals:   04/26/20 1005  TempSrc: Temporal  PainSc: 0-No pain         Complications: No complications documented.

## 2020-04-26 NOTE — Anesthesia Preprocedure Evaluation (Addendum)
Anesthesia Evaluation  Patient identified by MRN, date of birth, ID band Patient awake    Reviewed: Allergy & Precautions, H&P , NPO status , Patient's Chart, lab work & pertinent test results, reviewed documented beta blocker date and time   Airway Mallampati: II   Neck ROM: full    Dental  (+) Poor Dentition   Pulmonary neg pulmonary ROS,    Pulmonary exam normal        Cardiovascular Exercise Tolerance: Good On Medications negative cardio ROS Normal cardiovascular exam Rhythm:regular Rate:Normal     Neuro/Psych negative neurological ROS  negative psych ROS   GI/Hepatic Neg liver ROS, GERD  Medicated,  Endo/Other  negative endocrine ROSdiabetes, Well Controlled, Type 2, Oral Hypoglycemic Agents  Renal/GU negative Renal ROS  negative genitourinary   Musculoskeletal   Abdominal   Peds  Hematology negative hematology ROS (+)   Anesthesia Other Findings Past Medical History: No date: Arthritis     Comment:  OSTEOARTHRITIS No date: Colon polyp No date: Diabetes mellitus without complication (HCC) No date: Diffuse cystic mastopathy No date: Fatty liver No date: GERD (gastroesophageal reflux disease) No date: Hemorrhoid No date: Hyperlipidemia No date: Hypertension 2012: Shingles No date: Sinus problem No date: Vitamin D deficiency Past Surgical History: 1992: ABDOMINAL HYSTERECTOMY No date: BLEPHAROPLASTY; Bilateral No date: CATARACT EXTRACTION, BILATERAL 2009: COLONOSCOPY     Comment:  Dr Mechele Collin 2010: NASAL RECONSTRUCTION 2012: NASAL SINUS SURGERY 2000: NISSEN FUNDOPLICATION No date: TONSILLECTOMY No date: vocal chord surgery  BMI    Body Mass Index: 33.26 kg/m     Reproductive/Obstetrics negative OB ROS                            Anesthesia Physical Anesthesia Plan  ASA: III  Anesthesia Plan: Spinal   Post-op Pain Management:    Induction:   PONV Risk Score and  Plan: 3  Airway Management Planned:   Additional Equipment:   Intra-op Plan:   Post-operative Plan:   Informed Consent: I have reviewed the patients History and Physical, chart, labs and discussed the procedure including the risks, benefits and alternatives for the proposed anesthesia with the patient or authorized representative who has indicated his/her understanding and acceptance.     Dental Advisory Given  Plan Discussed with: CRNA  Anesthesia Plan Comments:        Anesthesia Quick Evaluation

## 2020-04-26 NOTE — Progress Notes (Signed)
PHARMACIST - PHYSICIAN COMMUNICATION  CONCERNING:  Enoxaparin (Lovenox) for DVT Prophylaxis    RECOMMENDATION: Patient was prescribed enoxaprin 40mg  q24 hours for VTE prophylaxis.   Filed Weights   04/26/20 1005  Weight: 87.9 kg (193 lb 12.6 oz)    Body mass index is 33.26 kg/m.  Estimated Creatinine Clearance: 54.6 mL/min (by C-G formula based on SCr of 1 mg/dL).   Based on [facility] policy patient is candidate for enoxaparin 0.5mg /kg TBW SQ every 24 hours based on BMI being >30.  DESCRIPTION: Pharmacy has adjusted enoxaparin dose per [facility] policy.  Patient is now receiving enoxaparin 45 mg every 24 hours    [facility], PharmD Clinical Pharmacist  04/26/2020 6:03 PM

## 2020-04-27 ENCOUNTER — Encounter: Payer: Self-pay | Admitting: Orthopedic Surgery

## 2020-04-27 LAB — GLUCOSE, CAPILLARY
Glucose-Capillary: 115 mg/dL — ABNORMAL HIGH (ref 70–99)
Glucose-Capillary: 109 mg/dL — ABNORMAL HIGH (ref 70–99)
Glucose-Capillary: 123 mg/dL — ABNORMAL HIGH (ref 70–99)

## 2020-04-27 MED ORDER — CELECOXIB 200 MG PO CAPS
200.0000 mg | ORAL_CAPSULE | Freq: Two times a day (BID) | ORAL | 0 refills | Status: AC
Start: 2020-04-27 — End: ?

## 2020-04-27 MED ORDER — ENOXAPARIN SODIUM 40 MG/0.4ML ~~LOC~~ SOLN: 40.0000 mg | SUBCUTANEOUS | 0 refills | Status: AC

## 2020-04-27 MED ORDER — OXYCODONE HCL 5 MG PO TABS: 5.0000 mg | ORAL_TABLET | ORAL | 0 refills | Status: AC | PRN

## 2020-04-27 NOTE — Evaluation (Signed)
Physical Therapy Evaluation Patient Details Name: Brianna Dudley MRN: [id number] DOB: 05/29/1945 Today's Date: 04/27/2020   History of Present Illness  75 y/o female s/p L TKA.  Clinical Impression  Pt did very well with PT exam, she reports very minimal pain, showed great quad control/strength, had confident and safe cadence with >200 ft of ambulation, was able to negotiate up/down 10+ steps w/o assist and has nearly 100 degrees of flexion.  Pt doing very well and has reached or exceeded typical POD1 goals.      Follow Up Recommendations Home health PT;Follow surgeon's recommendation for DC plan and follow-up therapies;Supervision - Intermittent    Equipment Recommendations  None recommended by PT    Recommendations for Other Services       Precautions / Restrictions Precautions Precautions: Fall Restrictions Weight Bearing Restrictions: Yes LUE Weight Bearing: Weight bearing as tolerated RLE Weight Bearing: Weight bearing as tolerated      Mobility  Bed Mobility Overal bed mobility: Independent             General bed mobility comments: easily transitions to sitting EOB    Transfers Overall transfer level: Modified independent Equipment used: Rolling walker (2 wheeled) Transfers: Sit to/from Stand Sit to Stand: Supervision Stand pivot transfers: Supervision       General transfer comment: min cuing for technique and hand placement, able to easily rise w/o assist  Ambulation/Gait Ambulation/Gait assistance: Supervision Gait Distance (Feet): 200 Feet Assistive device: Rolling walker (2 wheeled)       General Gait Details: Pt did very well with initial bout of ambulation.  She was able to easily and confidently circumambulate the nurses' station with consistent cadence, minimal walker reliance, no real fatigue and only minimal pain.  Stairs Stairs: Yes Stairs assistance: Min guard Stair Management: One rail Right;Sideways Number of Stairs: 10 General  stair comments: pt has wide (STE) and or R only rails (inside), trialed both scenarios with success  Wheelchair Mobility    Modified Rankin (Stroke Patients Only)       Balance Overall balance assessment: Needs assistance Sitting-balance support: Feet supported Sitting balance-Leahy Scale: Good     Standing balance support: During functional activity Standing balance-Leahy Scale: Good Standing balance comment: able to tolerate WBing on R LE relatively well, no hesitation and minimal reliance on RW/UEs                             Pertinent Vitals/Pain Pain Assessment: 0-10 Pain Score: 2  (increases with activity, but good overall tolerance t/o the session) Pain Location: L knee    Home Living Family/patient expects to be discharged to:: Private residence Living Arrangements: Spouse/significant other Available Help at Discharge: Family;Available PRN/intermittently Type of Home: House Home Access: Stairs to enter Entrance Stairs-Rails: Right;Left (wide) Entrance Stairs-Number of Steps: 3 STE Home Layout: Two level;1/2 bath on main level;Able to live on main level with bedroom/bathroom Home Equipment: Dan Humphreys - 2 wheels;Cane - single point;Bedside commode;Shower seat      Prior Function Level of Independence: Independent with assistive device(s)         Comments: Pt reports using a cane as needed PTA for functional mobility.     Hand Dominance   Dominant Hand: Right    Extremity/Trunk Assessment   Upper Extremity Assessment Upper Extremity Assessment: Overall WFL for tasks assessed    Lower Extremity Assessment Lower Extremity Assessment: Overall WFL for tasks assessed LLE Deficits / Details: s/p  L TKA (great quad control, solid SLR, very minimal pain responses with ROM)       Communication   Communication: No difficulties  Cognition Arousal/Alertness: Awake/alert Behavior During Therapy: WFL for tasks assessed/performed Overall Cognitive  Status: Within Functional Limits for tasks assessed                                        General Comments      Exercises Total Joint Exercises Ankle Circles/Pumps: AROM;10 reps Quad Sets: Strengthening;10 reps Short Arc Quad: Strengthening;10 reps Heel Slides: Strengthening;10 reps (with resisted leg ext) Hip ABduction/ADduction: Strengthening;10 reps Straight Leg Raises: AROM;Strengthening;10 reps Knee Flexion: PROM;5 reps Goniometric ROM: 0-98   Assessment/Plan    PT Assessment Patient needs continued PT services  PT Problem List Decreased strength;Decreased range of motion;Decreased activity tolerance;Decreased balance;Decreased mobility;Decreased knowledge of use of DME;Decreased safety awareness;Pain;Cardiopulmonary status limiting activity       PT Treatment Interventions Gait training;Stair training;Functional mobility training;Therapeutic activities;Therapeutic exercise;Balance training;Patient/family education;DME instruction    PT Goals (Current goals can be found in the Care Plan section)  Acute Rehab PT Goals Patient Stated Goal: to go home PT Goal Formulation: With patient Time For Goal Achievement: 05/11/20 Potential to Achieve Goals: Good    Frequency Min 2X/week   Barriers to discharge        Co-evaluation               AM-PAC PT "6 Clicks" Mobility  Outcome Measure Help needed turning from your back to your side while in a flat bed without using bedrails?: None Help needed moving from lying on your back to sitting on the side of a flat bed without using bedrails?: None Help needed moving to and from a bed to a chair (including a wheelchair)?: None Help needed standing up from a chair using your arms (e.g., wheelchair or bedside chair)?: None Help needed to walk in hospital room?: None Help needed climbing 3-5 steps with a railing? : None 6 Click Score: 24    End of Session Equipment Utilized During Treatment: Gait  belt Activity Tolerance: Patient tolerated treatment well Patient left: with chair alarm set;with call bell/phone within reach Nurse Communication: Mobility status PT Visit Diagnosis: Muscle weakness (generalized) (M62.81);Difficulty in walking, not elsewhere classified (R26.2);Pain Pain - Right/Left: Left Pain - part of body: Knee    Time: 0922-0957 PT Time Calculation (min) (ACUTE ONLY): 35 min   Charges:   PT Evaluation $PT Eval Low Complexity: 1 Low PT Treatments $Gait Training: 8-22 mins $Therapeutic Exercise: 8-22 mins        Malachi Pro, DPT 04/27/2020, 1:06 PM

## 2020-04-27 NOTE — Plan of Care (Signed)
Post-op dressing removed. Hemovac removed. Mini compression dressing applied. TED hose applied to operative leg.

## 2020-04-27 NOTE — Progress Notes (Signed)
Physical Therapy Treatment Patient Details Name: Brianna Dudley MRN: [id number] DOB: [birth year] Today's Date: 04/27/2020    History of Present Illness 75 y/o female s/p L TKA.    PT Comments    Pt continues to do very well and has had minimal pain and made great gains with strength, ROM, mobility.  She continues to have some mild confusion prior to verbal cuing on organizing appropriate stair negotiation but with additional light cuing negotiated safely and appropriate.  Pt had >100 degrees of AROM flexion, easily walked 300 ft with consistent cadence and generally has shown great POD1 progress.    Follow Up Recommendations  Home health PT;Follow surgeon's recommendation for DC plan and follow-up therapies;Supervision - Intermittent     Equipment Recommendations  None recommended by PT    Recommendations for Other Services       Precautions / Restrictions Precautions Precautions: Fall Restrictions RLE Weight Bearing: Weight bearing as tolerated    Mobility  Bed Mobility Overal bed mobility: Independent             General bed mobility comments: easily gets from sitting to supine w/o assist    Transfers Overall transfer level: Modified independent Equipment used: Rolling walker (2 wheeled) Transfers: Sit to/from Stand Sit to Stand: Supervision         General transfer comment: gentle reminders for hand placement, pt able to rise to standing and return to sitting w/o assist or issue  Ambulation/Gait Ambulation/Gait assistance: Supervision Gait Distance (Feet): 300 Feet Assistive device: Rolling walker (2 wheeled)       General Gait Details: Pt again quickly acquires a consistent and confident cadence.  Minimal reliance on UEs with ability to maintain consistent walker motion.  Good tolerance and safety.   Stairs Stairs: Yes Stairs assistance: Min guard Stair Management: One rail Right;Sideways Number of Stairs: 12 General stair comments: Less cuing  (though still needing some) to correctly negotiate up/down steps   Wheelchair Mobility    Modified Rankin (Stroke Patients Only)       Balance Overall balance assessment: Needs assistance Sitting-balance support: Feet supported Sitting balance-Leahy Scale: Good     Standing balance support: During functional activity Standing balance-Leahy Scale: Good Standing balance comment: able to tolerate WBing on R LE relatively well, no hesitation and minimal reliance on RW/UEs                            Cognition Arousal/Alertness: Awake/alert Behavior During Therapy: WFL for tasks assessed/performed Overall Cognitive Status: Within Functional Limits for tasks assessed                                        Exercises Total Joint Exercises Ankle Circles/Pumps: AROM;10 reps Quad Sets: Strengthening;10 reps Heel Slides: Strengthening;10 reps (with resited leg ext) Hip ABduction/ADduction: Strengthening;10 reps Straight Leg Raises: Strengthening;10 reps Knee Flexion: PROM;5 reps Goniometric ROM: AROM >100    General Comments        Pertinent Vitals/Pain Pain Score: 1  Pain Location: L knee (some increased pain with activity, but pain essentially a non-factor with this pt)    Home Living                      Prior Function            PT Goals (current goals can  now be found in the care plan section) Progress towards PT goals: Progressing toward goals    Frequency    BID      PT Plan Current plan remains appropriate    Co-evaluation              AM-PAC PT "6 Clicks" Mobility   Outcome Measure  Help needed turning from your back to your side while in a flat bed without using bedrails?: None Help needed moving from lying on your back to sitting on the side of a flat bed without using bedrails?: None Help needed moving to and from a bed to a chair (including a wheelchair)?: None Help needed standing up from a chair using  your arms (e.g., wheelchair or bedside chair)?: None Help needed to walk in hospital room?: None Help needed climbing 3-5 steps with a railing? : None 6 Click Score: 24    End of Session Equipment Utilized During Treatment: Gait belt Activity Tolerance: Patient tolerated treatment well Patient left: with chair alarm set;with call bell/phone within reach Nurse Communication: Mobility status PT Visit Diagnosis: Muscle weakness (generalized) (M62.81);Difficulty in walking, not elsewhere classified (R26.2);Pain Pain - Right/Left: Left Pain - part of body: Knee     Time: 1219-7588 PT Time Calculation (min) (ACUTE ONLY): 32 min  Charges:  $Gait Training: 8-22 mins $Therapeutic Exercise: 8-22 mins                     Malachi Pro, DPT 04/27/2020, 4:20 PM

## 2020-04-27 NOTE — Discharge Summary (Signed)
Physician Discharge Summary  Patient ID: Brianna Dudley MRN: [id number] DOB/AGE: [birth year] 75 y.o.  Admit date: 04/26/2020 Discharge date: 04/27/2020  Admission Diagnoses:  Total knee replacement status [Z96.659]  Surgeries:Procedure(s):  Left total knee arthroplasty using computer-assisted navigation  SURGEON:  Jena Gauss. M.D.  ASSISTANT: Baldwin Jamaica, PA-C (present and scrubbed throughout the case, critical for assistance with exposure, retraction, instrumentation, and closure)  ANESTHESIA: spinal  ESTIMATED BLOOD LOSS: 50 mL  FLUIDS REPLACED: 700 mL of crystalloid  TOURNIQUET TIME: 85 minutes  DRAINS: 2 medium Hemovac  SOFT TISSUE RELEASES: Anterior cruciate ligament, posterior cruciate ligament, deep medial collateral ligament, patellofemoral ligament  IMPLANTS UTILIZED: DePuy Attune size 7 posterior stabilized femoral component (cemented), size 6 rotating platform tibial component (cemented), 38 mm medialized dome patella (cemented), and a 7 mm stabilized rotating platform polyethylene insert.  Discharge Diagnoses: Patient Active Problem List   Diagnosis Date Noted  . Total knee replacement status 04/26/2020  . Fatty liver 12/14/2019  . Primary osteoarthritis of left knee 12/14/2019  . Hx of adenomatous colonic polyps 11/28/2017  . Type 2 diabetes mellitus without complication (HCC) 08/10/2014  . GERD (gastroesophageal reflux disease) 01/29/2014  . Hyperlipidemia 01/29/2014  . Hypertension 01/29/2014  . Vitamin D deficiency 01/29/2014  . History of fibrocystic disease of breast 09/17/2012    Past Medical History:  Diagnosis Date  . Arthritis    OSTEOARTHRITIS  . Colon polyp   . Diabetes mellitus without complication (HCC)   . Diffuse cystic mastopathy   . Fatty liver   . GERD (gastroesophageal reflux disease)   . Hemorrhoid   . Hyperlipidemia   . Hypertension   . Shingles 2012  . Sinus problem   . Vitamin D deficiency       Transfusion:    Consultants (if any):   Discharged Condition: Improved  Hospital Course: Brianna Dudley is an 75 y.o. female who was admitted 04/26/2020 with a diagnosis of left knee osteoarthritis and went to the operating room on 04/26/2020 and underwent left total knee arthroplasty. The patient received perioperative antibiotics for prophylaxis (see below). The patient tolerated the procedure well and was transported to PACU in stable condition. After meeting PACU criteria, the patient was subsequently transferred to the Orthopaedics/Rehabilitation unit.   The patient received DVT prophylaxis in the form of early mobilization, Lovenox, Foot Pumps and TED hose. A sacral pad had been placed and heels were elevated off of the bed with rolled towels in order to protect skin integrity. Foley catheter was discontinued on postoperative day #0. Wound drains were discontinued on postoperative day #1. The surgical incision was healing well without signs of infection.  Physical therapy was initiated postoperatively for transfers, gait training, and strengthening. Occupational therapy was initiated for activities of daily living and evaluation for assisted devices. Rehabilitation goals were reviewed in detail with the patient. The patient made steady progress with physical therapy and physical therapy recommended discharge to Home.   The patient achieved the preliminary goals of this hospitalization and was felt to be medically and orthopaedically appropriate for discharge.  She was given perioperative antibiotics:  Anti-infectives (From admission, onward)   Start     Dose/Rate Route Frequency Ordered Stop   04/26/20 1900  ceFAZolin (ANCEF) IVPB 2g/100 mL premix        2 g 200 mL/hr over 30 Minutes Intravenous Every 6 hours 04/26/20 1746 04/27/20 0518   04/26/20 1013  ceFAZolin (ANCEF) 2-4 GM/100ML-% IVPB  Note to Pharmacy: Letta Pate   : cabinet override      04/26/20 1013 04/26/20 1204    04/26/20 0600  ceFAZolin (ANCEF) IVPB 2g/100 mL premix        2 g 200 mL/hr over 30 Minutes Intravenous On call to O.R. 04/26/20 3235 04/26/20 1217    .  Recent vital signs:  Vitals:   04/27/20 1101 04/27/20 1502  BP: (!) 111/96 101/65  Pulse: 77 91  Resp: 16 17  Temp: 97.8 F (36.6 C) 97.9 F (36.6 C)  SpO2: 99% 100%    Recent laboratory studies:  No results for input(s): WBC, HGB, HCT, PLT, K, CL, CO2, BUN, CREATININE, GLUCOSE, CALCIUM, LABPT, INR in the last 72 hours.  Diagnostic Studies: DG Knee Left Port  Result Date: 04/26/2020 CLINICAL DATA:  Post left knee arthroplasty EXAM: PORTABLE LEFT KNEE - 1-2 VIEW COMPARISON:  10/17/2019 left knee MRI FINDINGS: Status post left total knee arthroplasty with well-positioned left distal femoral and left proximal tibial prostheses. No bone fracture. No left knee dislocation. No focal osseous lesions. Expected soft tissue gas within and surrounding the left knee joint. Vertical anterior midline skin staples. Surgical drain terminates in the left suprapatellar region. IMPRESSION: Satisfactory immediate postoperative appearance status post left total knee arthroplasty. Electronically Signed   By: Delbert Phenix M.D.   On: 04/26/2020 15:21    Discharge Medications:   Allergies as of 04/27/2020      Reactions   Codeine Nausea Only   Penicillins Hives   IgE = 10 (WNL) 04/22/2020   Tramadol Nausea Only      Medication List    TAKE these medications   atorvastatin 40 MG tablet Commonly known as: LIPITOR Take 40 mg by mouth daily.   CALCIUM 600 + D PO Take 1 tablet by mouth daily.   celecoxib 200 MG capsule Commonly known as: CELEBREX Take 1 capsule (200 mg total) by mouth 2 (two) times daily.   cholecalciferol 25 MCG (1000 UNIT) tablet Commonly known as: VITAMIN D3 Take 1,000 Units by mouth daily.   diclofenac Sodium 1 % Gel Commonly known as: VOLTAREN Apply 1 application topically 4 (four) times daily as needed (pain).    diphenhydramine-acetaminophen 25-500 MG Tabs tablet Commonly known as: TYLENOL PM Take 1 tablet by mouth at bedtime as needed (sleep).   enoxaparin 40 MG/0.4ML injection Commonly known as: LOVENOX Inject 0.4 mLs (40 mg total) into the skin daily for 14 days.   hyoscyamine 0.125 MG SL tablet Commonly known as: LEVSIN SL Place 0.125 mg under the tongue every 4 (four) hours as needed (spasms).   lisinopril-hydrochlorothiazide 20-12.5 MG tablet Commonly known as: ZESTORETIC Take 1 tablet by mouth daily.   melatonin 3 MG Tabs tablet Take 3 mg by mouth at bedtime as needed (sleep).   metFORMIN 500 MG tablet Commonly known as: GLUCOPHAGE Take 500 mg by mouth 2 (two) times daily.   multivitamin capsule Take 1 capsule by mouth daily.   oxyCODONE 5 MG immediate release tablet Commonly known as: Oxy IR/ROXICODONE Take 1 tablet (5 mg total) by mouth every 4 (four) hours as needed for moderate pain (pain score 4-6).            Durable Medical Equipment  (From admission, onward)         Start     Ordered   04/26/20 1747  DME Walker rolling  Once       Question:  Patient needs a walker to treat with  the following condition  Answer:  Total knee replacement status   04/26/20 1746   04/26/20 1747  DME Bedside commode  Once       Question:  Patient needs a bedside commode to treat with the following condition  Answer:  Total knee replacement status   04/26/20 1746          Disposition: home with home health PT      Follow-up Information    [facility] On 05/11/2020.   Specialty: Orthopedic Surgery Why: at 1:15pm Contact information: [address] [facility] [place] [address] [phone]        Donato Heinz, MD On 06/08/2020.   Specialty: Orthopedic Surgery Why: at 2:45pm Contact information: [address] [facility] [address] [facility] [place] [address] [phone]                 Lasandra Beech, PA-C 04/27/2020, 5:10 PM

## 2020-04-27 NOTE — Progress Notes (Addendum)
  Subjective: 1 Day Post-Op Procedure(s) (LRB): COMPUTER ASSISTED TOTAL KNEE ARTHROPLASTY (Left) Patient reports pain as well-controlled.   Patient is well, and has had no acute complaints or problems Plan is to go Home after hospital stay. Negative for chest pain and shortness of breath Fever: no Gastrointestinal: negative for nausea and vomiting.   Patient has not had a bowel movement.  Objective: Vital signs in last 24 hours: Temp:  [97.2 F (36.2 C)-98 F (36.7 C)] 97.6 F (36.4 C) (03/15 0729) Pulse Rate:  [60-99] 70 (03/15 0729) Resp:  [10-28] 15 (03/15 0729) BP: (78-117)/(54-76) 110/68 (03/15 0729) SpO2:  [93 %-100 %] 100 % (03/15 0729) Weight:  [87.9 kg] 87.9 kg (03/14 1005)  Intake/Output from previous day:  Intake/Output Summary (Last 24 hours) at 04/27/2020 0813 Last data filed at 04/27/2020 0614 Gross per 24 hour  Intake 2020.55 ml  Output 955 ml  Net 1065.55 ml    Intake/Output this shift: No intake/output data recorded.  Labs: No results for input(s): HGB in the last 72 hours. No results for input(s): WBC, RBC, HCT, PLT in the last 72 hours. No results for input(s): NA, K, CL, CO2, BUN, CREATININE, GLUCOSE, CALCIUM in the last 72 hours. No results for input(s): LABPT, INR in the last 72 hours.   EXAM General - Patient is Alert, Appropriate and Oriented Extremity - Neurovascular intact Dorsiflexion/Plantar flexion intact Compartment soft Dressing/Incision -Postoperative dressing remains in place., Polar Care in place and working. , Hemovac in place.  Motor Function - intact, moving foot and toes well on exam.  Cardiovascular- Regular rate and rhythm, no murmurs/rubs/gallops Respiratory- Lungs clear to auscultation bilaterally Gastrointestinal- soft, nontender and hypoactive bowel sounds   Assessment/Plan: 1 Day Post-Op Procedure(s) (LRB): COMPUTER ASSISTED TOTAL KNEE ARTHROPLASTY (Left) Active Problems:   Total knee replacement status  Estimated  body mass index is 33.26 kg/m as calculated from the following:   Height as of this encounter: 5\' 4"  (1.626 m).   Weight as of this encounter: 87.9 kg. Advance diet Up with therapy    DVT Prophylaxis - Lovenox, Ted hose and foot pumps Weight-Bearing as tolerated to left leg  , PA-C [facility] Orthopaedic Surgery 04/27/2020, 8:13 AM

## 2020-04-27 NOTE — Evaluation (Signed)
Occupational Therapy Evaluation Patient Details Name: KELIA GIBBON MRN: [id number] DOB: Apr 14, 1945 Today's Date: 04/27/2020    History of Present Illness 75 y/o female s/p L TKA.   Clinical Impression   Pt seated in recliner chair upon entering the room and agreeable to OT intervention. Pt reports being mod I at home PTA with use of AD as needed. Pt's husband is available intermittently during the day to assist as needed. Pt demonstrated ability to perform functional transfers and self care tasks with supervision overall with use of RW. Pt able to stand from low commode and perform hygiene without assistance. OT reviewed polar care as well with handout provided. OT educated and demonstrated how to increase I with LB self care. Pt verbalized understanding. Pt with no further skilled OT needs at this time. OT to SIGN OFF. Thank you for this referral.     Follow Up Recommendations  No OT follow up;Supervision - Intermittent    Equipment Recommendations  None recommended by OT       Precautions / Restrictions Precautions Precautions: Fall Restrictions Weight Bearing Restrictions: Yes LUE Weight Bearing: Weight bearing as tolerated      Mobility Bed Mobility               General bed mobility comments: seated in recliner chair    Transfers Overall transfer level: Needs assistance   Transfers: Sit to/from Stand;Stand Pivot Transfers Sit to Stand: Supervision Stand pivot transfers: Supervision       General transfer comment: min cuing for technique and hand placement    Balance Overall balance assessment: Needs assistance Sitting-balance support: Feet supported Sitting balance-Leahy Scale: Good     Standing balance support: During functional activity Standing balance-Leahy Scale: Fair Standing balance comment: reliance on RW for B UE support                           ADL either performed or assessed with clinical judgement   ADL Overall ADL's :  Needs assistance/impaired     Grooming: Wash/dry hands;Standing;Supervision/safety                   Toilet Transfer: Supervision/safety;Ambulation;Regular Toilet;RW;Grab bars Toilet Transfer Details (indicate cue type and reason): min cuing for technique with low commode Toileting- Clothing Manipulation and Hygiene: Supervision/safety;Sit to/from stand       Functional mobility during ADLs: Supervision/safety;Rolling walker       Vision Patient Visual Report: No change from baseline              Pertinent Vitals/Pain Pain Assessment: 0-10 Pain Score: 1  Pain Location: L knee     Hand Dominance Right   Extremity/Trunk Assessment Upper Extremity Assessment Upper Extremity Assessment: Overall WFL for tasks assessed   Lower Extremity Assessment Lower Extremity Assessment: LLE deficits/detail LLE Deficits / Details: s/p L TKA       Communication Communication Communication: No difficulties   Cognition Arousal/Alertness: Awake/alert Behavior During Therapy: WFL for tasks assessed/performed Overall Cognitive Status: Within Functional Limits for tasks assessed                                                Home Living Family/patient expects to be discharged to:: Private residence Living Arrangements: Spouse/significant other Available Help at Discharge: Family;Available PRN/intermittently Type of Home: House Home Access:  Stairs to enter [employer] of Steps: 3 STE Entrance Stairs-Rails: Right;Left Home Layout: Two level;1/2 bath on main level;Able to live on main level with bedroom/bathroom     Bathroom Shower/Tub: Walk-in shower         Home Equipment: Environmental consultant - 2 wheels;Cane - single point;Bedside commode;Shower seat          Prior Functioning/Environment Level of Independence: Independent with assistive device(s)        Comments: Pt reports using a cane as needed PTA for functional mobility.                  OT Goals(Current goals can be found in the care plan section) Acute Rehab OT Goals Patient Stated Goal: to go home OT Goal Formulation: With patient Time For Goal Achievement: 05/11/20 Potential to Achieve Goals: Good  OT Frequency:      AM-PAC OT "6 Clicks" Daily Activity     Outcome Measure Help from another person eating meals?: None Help from another person taking care of personal grooming?: None Help from another person toileting, which includes using toliet, bedpan, or urinal?: None Help from another person bathing (including washing, rinsing, drying)?: A Little Help from another person to put on and taking off regular upper body clothing?: None Help from another person to put on and taking off regular lower body clothing?: A Little 6 Click Score: 22   End of Session Equipment Utilized During Treatment: Rolling walker  Activity Tolerance: Patient tolerated treatment well Patient left: in chair;with call bell/phone within reach                   Time: 1004-1034 OT Time Calculation (min): 30 min Charges:  OT General Charges $OT Visit: 1 Visit OT Evaluation $OT Eval Low Complexity: 1 Low OT Treatments $Self Care/Home Management : 23-37 mins  Jackquline Denmark, MS, OTR/L , CBIS ascom [phone]  04/27/20, 12:16 PM

## 2020-04-27 NOTE — Anesthesia Postprocedure Evaluation (Signed)
Anesthesia Post Note  Patient: Brianna Dudley  Procedure(s) Performed: COMPUTER ASSISTED TOTAL KNEE ARTHROPLASTY (Left Knee)  Patient location during evaluation: Nursing Unit Anesthesia Type: Spinal Level of consciousness: awake, oriented and awake and alert Pain management: pain level controlled Vital Signs Assessment: post-procedure vital signs reviewed and stable Respiratory status: spontaneous breathing, respiratory function stable and nonlabored ventilation Cardiovascular status: blood pressure returned to baseline and stable Postop Assessment: no headache and no backache Anesthetic complications: no   No complications documented.   Last Vitals:  Vitals:   04/27/20 0624 04/27/20 0729  BP: 104/61 110/68  Pulse: 63 70  Resp: 18 15  Temp: (!) 36.3 C 36.4 C  SpO2: 99% 100%    Last Pain:  Vitals:   04/27/20 0624  TempSrc: Oral  PainSc:                  Ginger Carne

## 2020-04-27 NOTE — Progress Notes (Signed)
Spoke with the Patient to determine needs for Discharge She stated that she lives at home with her Husband She currently has a rolling walker and a Toilet riser She has done well with PT and is ready to discharge.  She has been set up with [facility] and confirmed with Rosey Bath CM called [employer] to arrange meals provided by her insurance  Spoke with Mia at [facility] and was transferred to [facility] He confirmed that the patient will get 14 healthy meals, Jed is setting up the meals to be delivered by [employer] in 2-3 days She is familiar with Lovenox and is comfortable to give to herself

## 2020-10-26 ENCOUNTER — Other Ambulatory Visit: Payer: Self-pay | Admitting: Physician Assistant

## 2020-10-26 DIAGNOSIS — Z1231 Encounter for screening mammogram for malignant neoplasm of breast: Secondary | ICD-10-CM

## 2020-11-29 ENCOUNTER — Other Ambulatory Visit: Payer: Self-pay

## 2020-11-29 ENCOUNTER — Ambulatory Visit
Admission: RE | Admit: 2020-11-29 | Discharge: 2020-11-29 | Disposition: A | Discharge status: Home / Self Care | Payer: Medicare Other | Source: Ambulatory Visit | Attending: Physician Assistant | Admitting: Physician Assistant

## 2020-11-29 DIAGNOSIS — Z1231 Encounter for screening mammogram for malignant neoplasm of breast: Secondary | ICD-10-CM | POA: Insufficient documentation

## 2021-04-13 IMAGING — MG DIGITAL SCREENING BILAT W/ TOMO W/ CAD
8 series · 8 of 24 positions shown · non-contrast
Comparison: Previous exam(s).

CLINICAL DATA: Screening.

EXAM:
DIGITAL SCREENING BILATERAL MAMMOGRAM WITH TOMO AND CAD

[L MLO synth-2D]
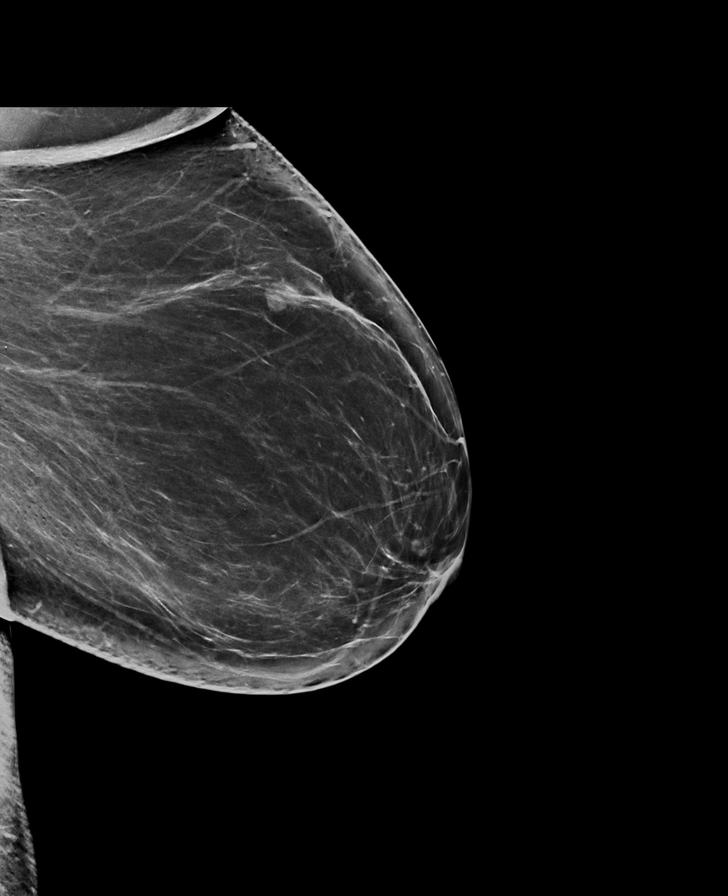

[L CC synth-2D]
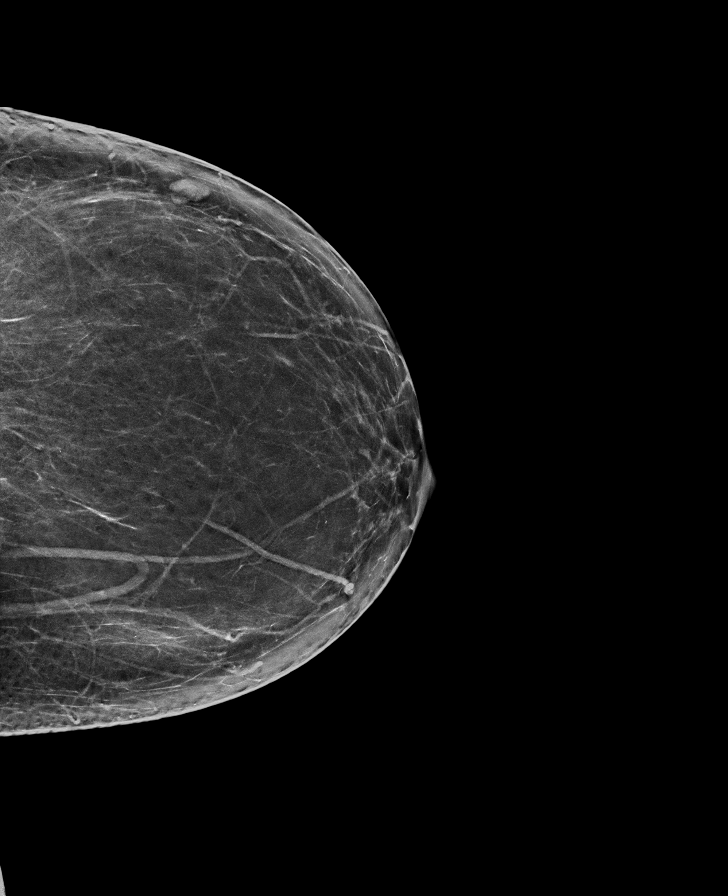

[R MLO synth-2D]
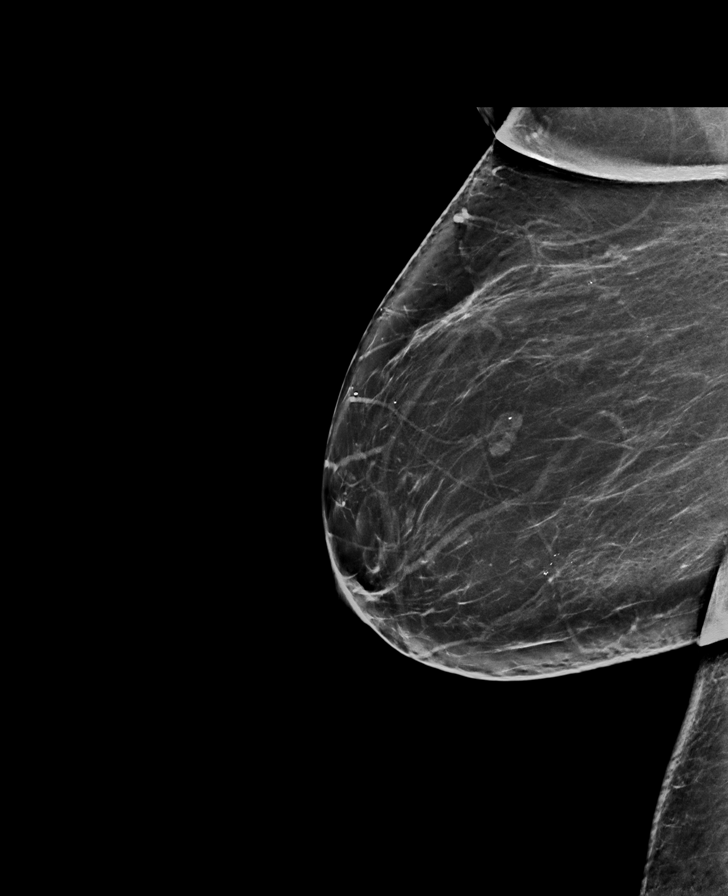

[R CC synth-2D]
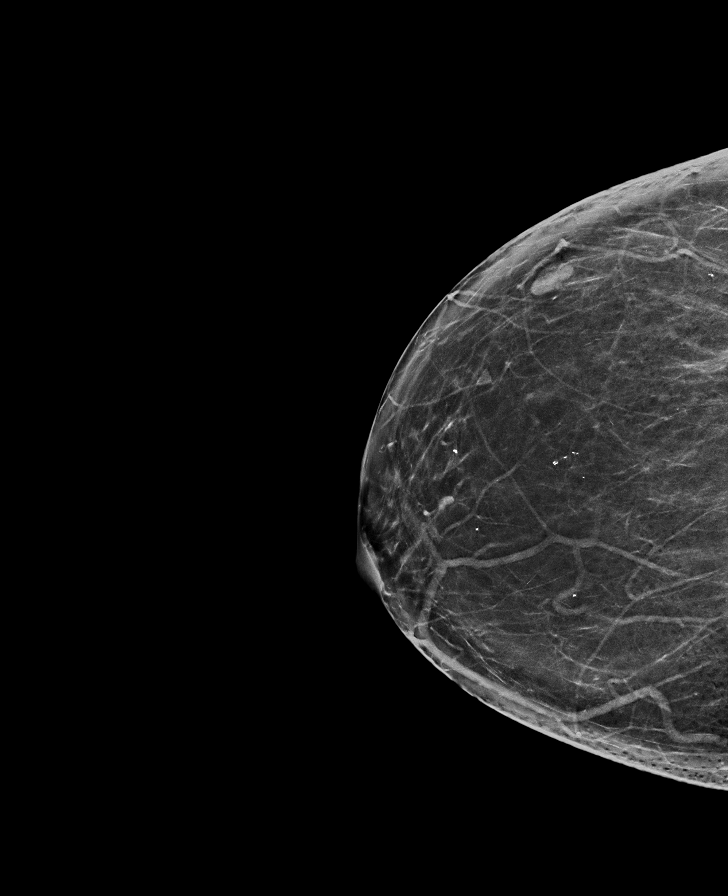

[L MLO tomo · tomo slice 42/83.0]
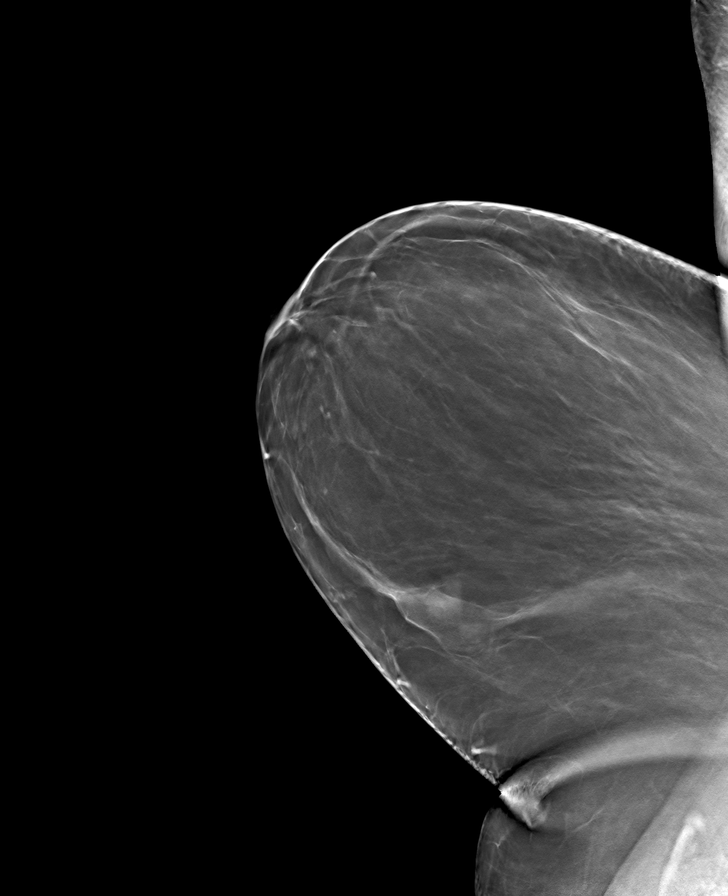

[R MLO tomo · tomo slice 43/84.0]
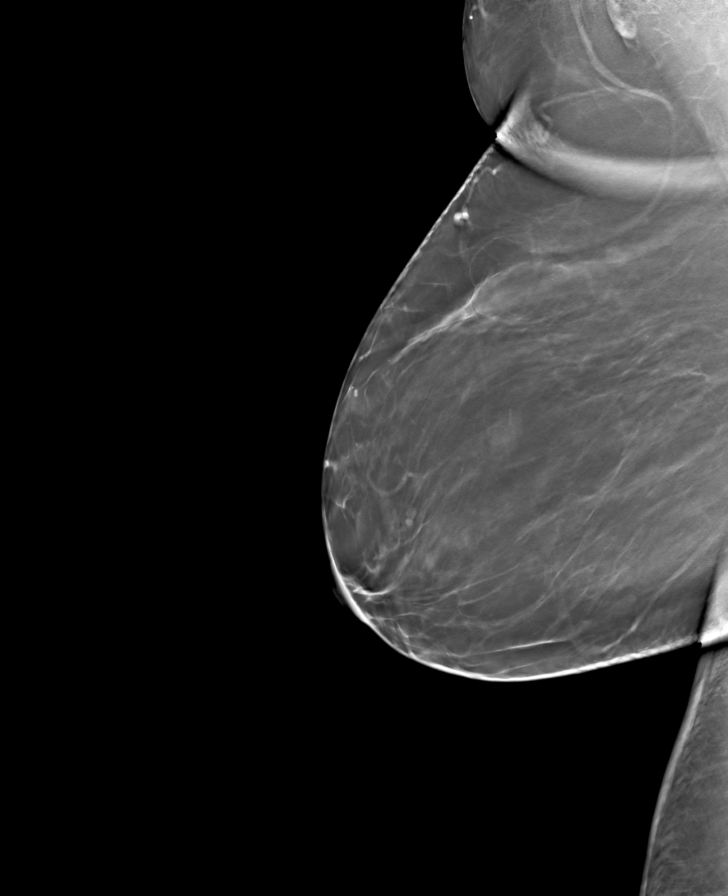

[R CC tomo · tomo slice 35/70.0]
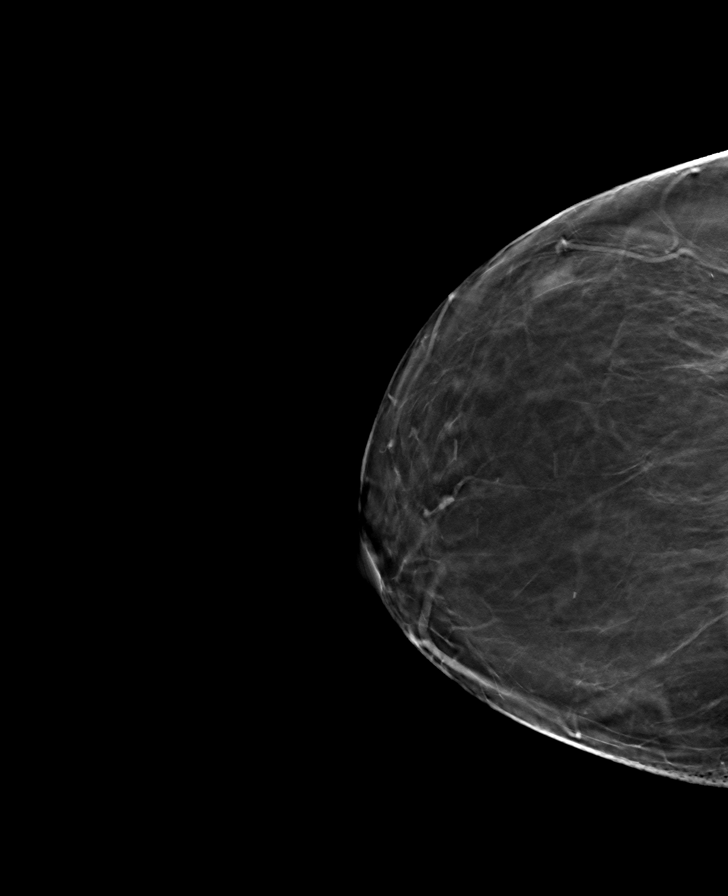

[L CC tomo · tomo slice 37/73.0]
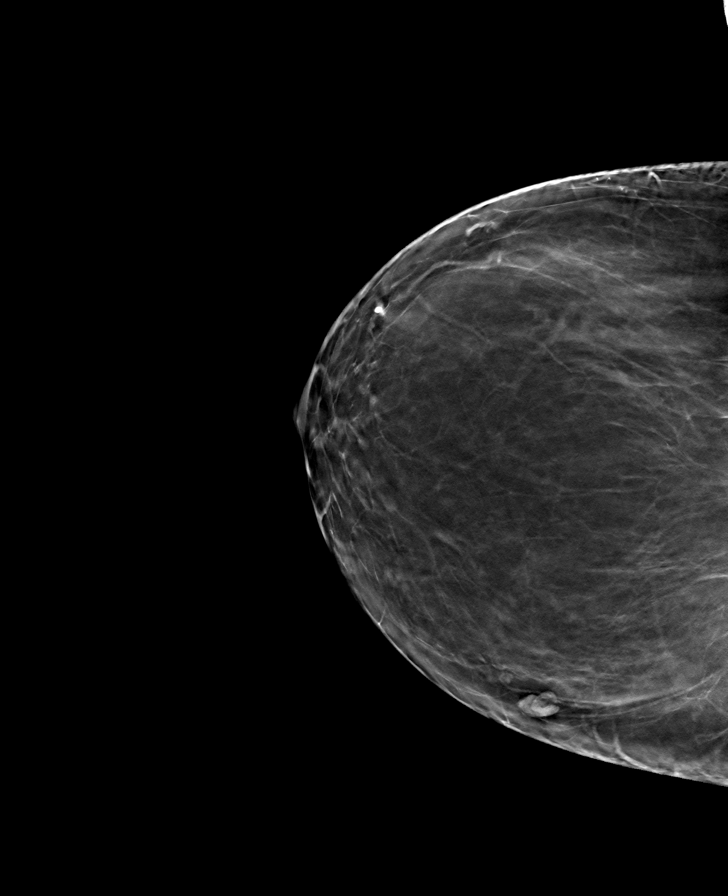

[8 of 24 positions shown; findings below may reference images not displayed]

ACR Breast Density Category b: There are scattered areas of
fibroglandular density.
FINDINGS: There are no findings suspicious for malignancy. Images were
processed with CAD.
IMPRESSION: No mammographic evidence of malignancy. A result letter of this
screening mammogram will be mailed directly to the patient.

RECOMMENDATION:
Screening mammogram in one year. (Code:[id number])

BI-RADS CATEGORY  1: Negative.

## 2021-10-31 ENCOUNTER — Other Ambulatory Visit: Payer: Self-pay | Admitting: Physician Assistant

## 2021-10-31 DIAGNOSIS — Z1231 Encounter for screening mammogram for malignant neoplasm of breast: Secondary | ICD-10-CM

## 2021-12-06 ENCOUNTER — Ambulatory Visit
Admission: RE | Admit: 2021-12-06 | Discharge: 2021-12-06 | Disposition: A | Discharge status: Home / Self Care | Payer: Medicare Other | Source: Ambulatory Visit | Attending: Physician Assistant | Admitting: Physician Assistant

## 2021-12-06 DIAGNOSIS — Z1231 Encounter for screening mammogram for malignant neoplasm of breast: Secondary | ICD-10-CM | POA: Diagnosis present

## 2022-10-23 ENCOUNTER — Other Ambulatory Visit: Payer: Self-pay | Admitting: Physician Assistant

## 2022-10-23 DIAGNOSIS — Z1231 Encounter for screening mammogram for malignant neoplasm of breast: Secondary | ICD-10-CM

## 2022-11-10 ENCOUNTER — Encounter: Payer: Self-pay | Admitting: Gastroenterology

## 2022-11-13 ENCOUNTER — Encounter
Admission: RE | Disposition: A | Discharge status: Home / Self Care | Payer: Self-pay | Source: Home / Self Care | Attending: Gastroenterology

## 2022-11-13 ENCOUNTER — Ambulatory Visit: Payer: Medicare PPO | Admitting: Certified Registered"

## 2022-11-13 ENCOUNTER — Ambulatory Visit
Admission: RE | Admit: 2022-11-13 | Discharge: 2022-11-13 | Disposition: A | Discharge status: Home / Self Care | Payer: Medicare PPO | Attending: Gastroenterology | Admitting: Gastroenterology

## 2022-11-13 ENCOUNTER — Encounter: Payer: Self-pay | Admitting: Gastroenterology

## 2022-11-13 DIAGNOSIS — K529 Noninfective gastroenteritis and colitis, unspecified: Secondary | ICD-10-CM | POA: Diagnosis present

## 2022-11-13 DIAGNOSIS — K640 First degree hemorrhoids: Secondary | ICD-10-CM | POA: Diagnosis not present

## 2022-11-13 DIAGNOSIS — K5140 Inflammatory polyps of colon without complications: Secondary | ICD-10-CM | POA: Insufficient documentation

## 2022-11-13 DIAGNOSIS — D122 Benign neoplasm of ascending colon: Secondary | ICD-10-CM | POA: Diagnosis not present

## 2022-11-13 DIAGNOSIS — E119 Type 2 diabetes mellitus without complications: Secondary | ICD-10-CM | POA: Diagnosis not present

## 2022-11-13 DIAGNOSIS — I10 Essential (primary) hypertension: Secondary | ICD-10-CM | POA: Diagnosis not present

## 2022-11-13 DIAGNOSIS — Z7984 Long term (current) use of oral hypoglycemic drugs: Secondary | ICD-10-CM | POA: Insufficient documentation

## 2022-11-13 DIAGNOSIS — K635 Polyp of colon: Secondary | ICD-10-CM | POA: Insufficient documentation

## 2022-11-13 DIAGNOSIS — K621 Rectal polyp: Secondary | ICD-10-CM | POA: Diagnosis not present

## 2022-11-13 HISTORY — PX: POLYPECTOMY: SHX5525

## 2022-11-13 HISTORY — PX: BIOPSY: SHX5522

## 2022-11-13 HISTORY — PX: COLONOSCOPY WITH PROPOFOL: SHX5780

## 2022-11-13 LAB — GLUCOSE, CAPILLARY: Glucose-Capillary: 119 mg/dL — ABNORMAL HIGH (ref 70–99)

## 2022-11-13 SURGERY — COLONOSCOPY WITH PROPOFOL
Anesthesia: General

## 2022-11-13 MED ORDER — PROPOFOL 500 MG/50ML IV EMUL
INTRAVENOUS | Status: DC | PRN
  Administered 2022-11-13: 150 ug/kg/min via INTRAVENOUS

## 2022-11-13 MED ORDER — SODIUM CHLORIDE 0.9 % IV SOLN
INTRAVENOUS | Status: DC
  Administered 2022-11-13: 20 mL/h via INTRAVENOUS

## 2022-11-13 MED ORDER — PROPOFOL 10 MG/ML IV BOLUS
INTRAVENOUS | Status: DC | PRN
  Administered 2022-11-13: 50 mg via INTRAVENOUS

## 2022-11-13 MED ORDER — LIDOCAINE HCL (CARDIAC) PF 100 MG/5ML IV SOSY
PREFILLED_SYRINGE | INTRAVENOUS | Status: DC | PRN
  Administered 2022-11-13: 50 mg via INTRAVENOUS

## 2022-11-13 NOTE — Anesthesia Postprocedure Evaluation (Signed)
Anesthesia Post Note  Patient: Brianna Dudley  Procedure(s) Performed: COLONOSCOPY WITH PROPOFOL POLYPECTOMY  Patient location during evaluation: Endoscopy Anesthesia Type: General Level of consciousness: awake and alert Pain management: pain level controlled Vital Signs Assessment: post-procedure vital signs reviewed and stable Respiratory status: spontaneous breathing, nonlabored ventilation, respiratory function stable and patient connected to nasal cannula oxygen Cardiovascular status: blood pressure returned to baseline and stable Postop Assessment: no apparent nausea or vomiting Anesthetic complications: no   No notable events documented.   Last Vitals:  Vitals:   11/13/22 1123 11/13/22 1133  BP: 127/79 (!) 146/82  Pulse: 78 77  Resp: (!) 28 18  Temp:    SpO2: 100% 99%    Last Pain:  Vitals:   11/13/22 1133  TempSrc:   PainSc: 0-No pain                 Corinda Gubler

## 2022-11-13 NOTE — Interval H&P Note (Signed)
History and Physical Interval Note: Preprocedure H&P from 11/13/22  was reviewed and there was no interval change after seeing and examining the patient.  Written consent was obtained from the patient after discussion of risks, benefits, and alternatives. Patient has consented to proceed with Colonoscopy with possible intervention   11/13/2022 10:22 AM  Thurnell Lose  has presented today for surgery, with the diagnosis of V12.72 (ICD-9-CM) - Z86.010 (ICD-10-CM) - Hx of adenomatous colonic polyps 787.99 (ICD-9-CM) - R19.4 (ICD-10-CM) - Change in bowel habits 787.91 (ICD-9-CM) - R19.7 (ICD-10-CM) - Diarrhea, unspecified type.  The various methods of treatment have been discussed with the patient and family. After consideration of risks, benefits and other options for treatment, the patient has consented to  Procedure(s): COLONOSCOPY WITH PROPOFOL (N/A) as a surgical intervention.  The patient's history has been reviewed, patient examined, no change in status, stable for surgery.  I have reviewed the patient's chart and labs.  Questions were answered to the patient's satisfaction.     Jaynie Collins

## 2022-11-13 NOTE — Anesthesia Procedure Notes (Signed)
Procedure Name: MAC Date/Time: 11/13/2022 10:26 AM  Performed by: Cheral Bay, CRNAPre-anesthesia Checklist: Patient identified, Emergency Drugs available, Suction available, Patient being monitored and Timeout performed Patient Re-evaluated:Patient Re-evaluated prior to induction Oxygen Delivery Method: Nasal cannula Induction Type: IV induction Placement Confirmation: positive ETCO2 and CO2 detector

## 2022-11-13 NOTE — Anesthesia Preprocedure Evaluation (Signed)
Anesthesia Evaluation  Patient identified by MRN, date of birth, ID band Patient awake    Reviewed: Allergy & Precautions, NPO status , Patient's Chart, lab work & pertinent test results  History of Anesthesia Complications Negative for: history of anesthetic complications  Airway Mallampati: II  TM Distance: >3 FB Neck ROM: Full    Dental  (+) Partial Lower   Pulmonary neg pulmonary ROS, neg sleep apnea, neg COPD, Patient abstained from smoking.Not current smoker   Pulmonary exam normal breath sounds clear to auscultation       Cardiovascular Exercise Tolerance: Good METShypertension, (-) CAD and (-) Past MI (-) dysrhythmias  Rhythm:Regular Rate:Normal - Systolic murmurs    Neuro/Psych negative neurological ROS  negative psych ROS   GI/Hepatic ,GERD  Medicated and Controlled,,(+)     (-) substance abuse    Endo/Other  diabetes, Well Controlled, Oral Hypoglycemic Agents    Renal/GU negative Renal ROS     Musculoskeletal   Abdominal  (+) + obese  Peds  Hematology   Anesthesia Other Findings Past Medical History: No date: Arthritis     Comment:  OSTEOARTHRITIS No date: Colon polyp No date: Diabetes mellitus without complication (HCC) No date: Diffuse cystic mastopathy No date: Fatty liver No date: GERD (gastroesophageal reflux disease) No date: Hemorrhoid No date: Hyperlipidemia No date: Hypertension 2012: Shingles No date: Sinus problem No date: Vitamin D deficiency  Reproductive/Obstetrics                             Anesthesia Physical Anesthesia Plan  ASA: 2  Anesthesia Plan: General   Post-op Pain Management: Minimal or no pain anticipated   Induction: Intravenous  PONV Risk Score and Plan: 3 and Propofol infusion, TIVA and Ondansetron  Airway Management Planned: Nasal Cannula  Additional Equipment: None  Intra-op Plan:   Post-operative Plan:   Informed  Consent: I have reviewed the patients History and Physical, chart, labs and discussed the procedure including the risks, benefits and alternatives for the proposed anesthesia with the patient or authorized representative who has indicated his/her understanding and acceptance.     Dental advisory given  Plan Discussed with: CRNA and Surgeon  Anesthesia Plan Comments: (Discussed risks of anesthesia with patient, including possibility of difficulty with spontaneous ventilation under anesthesia necessitating airway intervention, PONV, and rare risks such as cardiac or respiratory or neurological events, and allergic reactions. Discussed the role of CRNA in patient's perioperative care. Patient understands.)       Anesthesia Quick Evaluation

## 2022-11-13 NOTE — Transfer of Care (Signed)
Immediate Anesthesia Transfer of Care Note  Patient: Brianna Dudley  Procedure(s) Performed: COLONOSCOPY WITH PROPOFOL POLYPECTOMY  Patient Location: PACU and Endoscopy Unit  Anesthesia Type:General  Level of Consciousness: awake  Airway & Oxygen Therapy: Patient Spontanous Breathing  Post-op Assessment: Report given to RN and Post -op Vital signs reviewed and stable  Post vital signs: Reviewed and stable  Last Vitals:  Vitals Value Taken Time  BP 114/67 11/13/22 1104  Temp    Pulse 90 11/13/22 1104  Resp 25 11/13/22 1104  SpO2 97 % 11/13/22 1104  Vitals shown include unfiled device data.  Last Pain:  Vitals:   11/13/22 1103  TempSrc:   PainSc: 0-No pain         Complications: No notable events documented.

## 2022-11-13 NOTE — H&P (Signed)
Pre-Procedure H&P   Patient ID: Brianna Dudley is a 77 y.o. female.  Gastroenterology Provider: Jaynie Collins, DO  Referring Provider: Fransico Setters, NP PCP: Patrice Paradise, MD  Date: 11/13/2022  HPI Brianna Dudley is a 77 y.o. female who presents today for Colonoscopy for diarrhea .  Patient has had chronic diarrhea for approximately 3 years.  This was worse with metformin.  It improves slightly with delayed release metformin.  She still has loose bowel movements 4 to 7 days a week.  No melena or hematochezia.  Occasional abdominal discomfort resolves with BM. Fecal calprotectin, other inflammatory markers and infectious workup have been negative  Gallbladder still intact  No family history of colon cancer or colon polyps   Past Medical History:  Diagnosis Date   Arthritis    OSTEOARTHRITIS   Colon polyp    Diabetes mellitus without complication (HCC)    Diffuse cystic mastopathy    Fatty liver    GERD (gastroesophageal reflux disease)    Hemorrhoid    Hyperlipidemia    Hypertension    Shingles 2012   Sinus problem    Vitamin D deficiency     Past Surgical History:  Procedure Laterality Date   ABDOMINAL HYSTERECTOMY  1992   BLEPHAROPLASTY Bilateral    CATARACT EXTRACTION, BILATERAL     COLONOSCOPY  2009   Dr Mechele Collin   KNEE ARTHROPLASTY Left 04/26/2020   Procedure: COMPUTER ASSISTED TOTAL KNEE ARTHROPLASTY;  Surgeon: Donato Heinz, MD;  Location: [facility] ORS;  Service: Orthopedics;  Laterality: Left;   NASAL RECONSTRUCTION  2010   NASAL SINUS SURGERY  2012   NISSEN FUNDOPLICATION  2000   TONSILLECTOMY     vocal chord surgery       Family History No h/o GI disease or malignancy  Review of Systems  Constitutional:  Negative for activity change, appetite change, chills, diaphoresis, fatigue, fever and unexpected weight change.  HENT:  Negative for trouble swallowing and voice change.   Respiratory:  Negative for shortness of breath and  wheezing.   Cardiovascular:  Negative for chest pain, palpitations and leg swelling.  Gastrointestinal:  Positive for diarrhea. Negative for abdominal distention, abdominal pain, anal bleeding, blood in stool, constipation, nausea, rectal pain and vomiting.  Musculoskeletal:  Negative for arthralgias and myalgias.  Skin:  Negative for color change and pallor.  Neurological:  Negative for dizziness, syncope and weakness.  Psychiatric/Behavioral:  Negative for confusion.   All other systems reviewed and are negative.    Medications No current facility-administered medications on file prior to encounter.   Current Outpatient Medications on File Prior to Encounter  Medication Sig Dispense Refill   atorvastatin (LIPITOR) 40 MG tablet Take 40 mg by mouth daily.      Calcium Carb-Cholecalciferol (CALCIUM 600 + D PO) Take 1 tablet by mouth daily.     celecoxib (CELEBREX) 200 MG capsule Take 1 capsule (200 mg total) by mouth 2 (two) times daily. 90 capsule 0   cholecalciferol (VITAMIN D3) 25 MCG (1000 UNIT) tablet Take 1,000 Units by mouth daily.     diclofenac Sodium (VOLTAREN) 1 % GEL Apply 1 application topically 4 (four) times daily as needed (pain).     hyoscyamine (LEVSIN SL) 0.125 MG SL tablet Place 0.125 mg under the tongue every 4 (four) hours as needed (spasms).     lisinopril-hydrochlorothiazide (PRINZIDE,ZESTORETIC) 20-12.5 MG per tablet Take 1 tablet by mouth daily.      melatonin 3 MG TABS  tablet Take 3 mg by mouth at bedtime as needed (sleep).     metFORMIN (GLUCOPHAGE) 500 MG tablet Take 500 mg by mouth 2 (two) times daily.     Multiple Vitamin (MULTIVITAMIN) capsule Take 1 capsule by mouth daily.     diphenhydramine-acetaminophen (TYLENOL PM) 25-500 MG TABS tablet Take 1 tablet by mouth at bedtime as needed (sleep). (Patient not taking: Reported on 11/06/2022)     enoxaparin (LOVENOX) 40 MG/0.4ML injection Inject 0.4 mLs (40 mg total) into the skin daily for 14 days. 5.6 mL 0    oxyCODONE (OXY IR/ROXICODONE) 5 MG immediate release tablet Take 1 tablet (5 mg total) by mouth every 4 (four) hours as needed for moderate pain (pain score 4-6). (Patient not taking: Reported on 11/06/2022) 30 tablet 0    Pertinent medications related to GI and procedure were reviewed by me with the patient prior to the procedure   Current Facility-Administered Medications:    0.9 %  sodium chloride infusion, , Intravenous, Continuous, Jaynie Collins, DO, Last Rate: 20 mL/hr at 11/13/22 1012, Continued from Pre-op at 11/13/22 1012  sodium chloride 20 mL/hr at 11/13/22 1012       Allergies  Allergen Reactions   Codeine Nausea Only   Penicillins Hives    IgE = 10 (WNL) 04/22/2020   Tramadol Nausea Only   Allergies were reviewed by me prior to the procedure  Objective   Body mass index is 32.51 kg/m. Vitals:   11/13/22 0943  BP: 127/70  Pulse: 99  Resp: 20  Temp: (!) 96.2 F (35.7 C)  TempSrc: Temporal  SpO2: 99%  Weight: 85.9 kg  Height: 5\' 4"  (1.626 m)     Physical Exam Vitals and nursing note reviewed.  Constitutional:      General: She is not in acute distress.    Appearance: Normal appearance. She is not ill-appearing, toxic-appearing or diaphoretic.  HENT:     Head: Normocephalic and atraumatic.     Nose: Nose normal.     Mouth/Throat:     Mouth: Mucous membranes are moist.     Pharynx: Oropharynx is clear.  Eyes:     General: No scleral icterus.    Extraocular Movements: Extraocular movements intact.  Cardiovascular:     Rate and Rhythm: Normal rate and regular rhythm.     Heart sounds: Normal heart sounds. No murmur heard.    No friction rub. No gallop.  Pulmonary:     Effort: Pulmonary effort is normal. No respiratory distress.     Breath sounds: Normal breath sounds. No wheezing, rhonchi or rales.  Abdominal:     General: Bowel sounds are normal. There is no distension.     Palpations: Abdomen is soft.     Tenderness: There is no abdominal  tenderness. There is no guarding or rebound.  Musculoskeletal:     Cervical back: Neck supple.     Right lower leg: No edema.     Left lower leg: No edema.  Skin:    General: Skin is warm and dry.     Coloration: Skin is not jaundiced or pale.  Neurological:     General: No focal deficit present.     Mental Status: She is alert and oriented to person, place, and time. Mental status is at baseline.  Psychiatric:        Mood and Affect: Mood normal.        Behavior: Behavior normal.        Thought Content:  Thought content normal.        Judgment: Judgment normal.      Assessment:  Brianna Dudley is a 76 y.o. female  who presents today for Colonoscopy for diarrhea .  Plan:  Colonoscopy with possible intervention today  Colonoscopy with possible biopsy, control of bleeding, polypectomy, and interventions as necessary has been discussed with the patient/patient representative. Informed consent was obtained from the patient/patient representative after explaining the indication, nature, and risks of the procedure including but not limited to death, bleeding, perforation, missed neoplasm/lesions, cardiorespiratory compromise, and reaction to medications. Opportunity for questions was given and appropriate answers were provided. Patient/patient representative has verbalized understanding is amenable to undergoing the procedure.   Jaynie Collins, DO  [facility] Gastroenterology  Portions of the record may have been created with voice recognition software. Occasional wrong-word or 'sound-a-like' substitutions may have occurred due to the inherent limitations of voice recognition software.  Read the chart carefully and recognize, using context, where substitutions may have occurred.

## 2022-11-13 NOTE — Op Note (Addendum)
[facility] Gastroenterology Patient Name: Brianna Dudley Procedure Date: 11/13/2022 10:09 AM MRN: [id number] Account #: [id number] Date of Birth: 11-06-1945 Admit Type: Outpatient Age: 77 Room: [facility] ENDO ROOM 2 Gender: Female Note Status: Supervisor Override Instrument Name: Prentice Docker [id number] Procedure:             Colonoscopy Indications:           Colon polyps Chronic diarrhea Providers:             Jaynie Collins DO, DO Referring MD:          Marilynne Halsted, MD (Referring MD) Medicines:             Monitored Anesthesia Care Complications:         No immediate complications. Estimated blood loss:                         Minimal. Procedure:             Pre-Anesthesia Assessment:                        - Prior to the procedure, a History and Physical was                         performed, and patient medications and allergies were                         reviewed. The patient is competent. The risks and                         benefits of the procedure and the sedation options and                         risks were discussed with the patient. All questions                         were answered and informed consent was obtained.                         Patient identification and proposed procedure were                         verified by the physician, the nurse, the anesthetist                         and the technician in the endoscopy suite. Mental                         Status Examination: alert and oriented. Airway                         Examination: normal oropharyngeal airway and neck                         mobility. Respiratory Examination: clear to                         auscultation. CV Examination: RRR, no murmurs, no S3  or S4. Prophylactic Antibiotics: The patient does not                         require prophylactic antibiotics. Prior                         Anticoagulants: The patient has taken no  anticoagulant                         or antiplatelet agents. ASA Grade Assessment: II - A                         patient with mild systemic disease. After reviewing                         the risks and benefits, the patient was deemed in                         satisfactory condition to undergo the procedure. The                         anesthesia plan was to use monitored anesthesia care                         (MAC). Immediately prior to administration of                         medications, the patient was re-assessed for adequacy                         to receive sedatives. The heart rate, respiratory                         rate, oxygen saturations, blood pressure, adequacy of                         pulmonary ventilation, and response to care were                         monitored throughout the procedure. The physical                         status of the patient was re-assessed after the                         procedure.                        After obtaining informed consent, the colonoscope was                         passed under direct vision. Throughout the procedure,                         the patient's blood pressure, pulse, and oxygen                         saturations were monitored continuously. The  Colonoscope was introduced through the anus and                         advanced to the the terminal ileum, with                         identification of the appendiceal orifice and IC                         valve. The colonoscopy was somewhat difficult due to                         restricted mobility of the colon. Successful                         completion of the procedure was aided by applying                         abdominal pressure and lavage. The patient tolerated                         the procedure well. The quality of the bowel                         preparation was evaluated using the BBPS [facility] Bowel                          Preparation Scale) with scores of: Right Colon = 2                         (minor amount of residual staining, small fragments of                         stool and/or opaque liquid, but mucosa seen well),                         Transverse Colon = 2 (minor amount of residual                         staining, small fragments of stool and/or opaque                         liquid, but mucosa seen well) and Left Colon = 3                         (entire mucosa seen well with no residual staining,                         small fragments of stool or opaque liquid). The total                         BBPS score equals 7. The quality of the bowel                         preparation was good. The terminal ileum, ileocecal  valve, appendiceal orifice, and rectum were                         photographed. Findings:      The perianal and digital rectal examinations were normal. Pertinent       negatives include normal sphincter tone.      The terminal ileum appeared normal. Estimated blood loss: none.      Retroflexion in the right colon was performed.      Non-bleeding internal hemorrhoids were found during retroflexion. The       hemorrhoids were Grade I (internal hemorrhoids that do not prolapse).       Estimated blood loss: none.      A 4 to 5 mm polyp was found in the rectum. The polyp was pedunculated.       The polyp was removed with a hot snare. Resection and retrieval were       complete. Estimated blood loss was minimal.      Four sessile polyps were found in the rectum (1), ascending colon (2)       and cecum (1). The polyps were 1 to 2 mm in size. These polyps were       removed with a jumbo cold forceps. Resection and retrieval were complete.      Normal mucosa was found in the entire colon. Biopsies for histology were       taken with a cold forceps from the right colon and left colon for       evaluation of microscopic colitis. Estimated blood loss was minimal.       The exam was otherwise without abnormality on direct and retroflexion       views. Impression:            - The examined portion of the ileum was normal.                        - Non-bleeding internal hemorrhoids.                        - One 4 to 5 mm polyp in the rectum, removed with a                         hot snare. Resected and retrieved.                        - Four 1 to 2 mm polyps in the rectum, in the                         ascending colon and in the cecum, removed with a jumbo                         cold forceps. Resected and retrieved.                        - Normal mucosa in the entire examined colon. Biopsied.                        - The examination was otherwise normal on direct and  retroflexion views. Recommendation:        - Patient has a contact number available for                         emergencies. The signs and symptoms of potential                         delayed complications were discussed with the patient.                         Return to normal activities tomorrow. Written                         discharge instructions were provided to the patient.                        - Discharge patient to home.                        - Resume previous diet.                        - Continue present medications.                        - No ibuprofen, naproxen, or other non-steroidal                         anti-inflammatory drugs for 5 days after polyp removal.                        - Await pathology results.                        - Repeat colonoscopy for surveillance based on                         pathology results.                        - Return to referring physician as previously                         scheduled.                        - The findings and recommendations were discussed with                         the patient. Procedure Code(s):     --- Professional ---                        [phone], Colonoscopy, flexible; with  removal of                         tumor(s), polyp(s), or other lesion(s) by snare                         technique                        45380, 59,  Colonoscopy, flexible; with biopsy, single                         or multiple Diagnosis Code(s):     --- Professional ---                        K64.0, First degree hemorrhoids                        D12.8, Benign neoplasm of rectum                        D12.2, Benign neoplasm of ascending colon                        D12.0, Benign neoplasm of cecum                        K52.9, Noninfective gastroenteritis and colitis,                         unspecified CPT copyright 2022 [employer]. All rights reserved. The codes documented in this report are preliminary and upon coder review may  be revised to meet current compliance requirements. Attending Participation:      I personally performed the entire procedure. Elfredia Nevins, DO Jaynie Collins DO, DO 11/13/2022 11:03:11 AM This report has been signed electronically. Number of Addenda: 0 Note Initiated On: 11/13/2022 10:09 AM Scope Withdrawal Time: 0 hours 20 minutes 13 seconds  Total Procedure Duration: 0 hours 29 minutes 21 seconds  Estimated Blood Loss:  Estimated blood loss was minimal.      [facility]

## 2022-11-14 ENCOUNTER — Encounter: Payer: Self-pay | Admitting: Gastroenterology

## 2022-11-14 LAB — SURGICAL PATHOLOGY

## 2022-12-08 ENCOUNTER — Ambulatory Visit
Admission: RE | Admit: 2022-12-08 | Discharge: 2022-12-08 | Disposition: A | Discharge status: Home / Self Care | Payer: Medicare PPO | Source: Ambulatory Visit | Attending: Physician Assistant | Admitting: Physician Assistant

## 2022-12-08 DIAGNOSIS — Z1231 Encounter for screening mammogram for malignant neoplasm of breast: Secondary | ICD-10-CM | POA: Insufficient documentation

## 2023-04-13 ENCOUNTER — Other Ambulatory Visit: Payer: Self-pay | Admitting: Nurse Practitioner

## 2023-04-13 DIAGNOSIS — R110 Nausea: Secondary | ICD-10-CM

## 2023-04-13 DIAGNOSIS — K760 Fatty (change of) liver, not elsewhere classified: Secondary | ICD-10-CM

## 2023-04-17 ENCOUNTER — Ambulatory Visit
Admission: RE | Admit: 2023-04-17 | Discharge: 2023-04-17 | Disposition: A | Discharge status: Home / Self Care | Source: Ambulatory Visit | Attending: Nurse Practitioner | Admitting: Nurse Practitioner

## 2023-04-17 DIAGNOSIS — K760 Fatty (change of) liver, not elsewhere classified: Secondary | ICD-10-CM | POA: Insufficient documentation

## 2023-04-17 DIAGNOSIS — R110 Nausea: Secondary | ICD-10-CM | POA: Diagnosis present

## 2023-11-05 ENCOUNTER — Other Ambulatory Visit: Payer: Self-pay | Admitting: Physician Assistant

## 2023-11-05 DIAGNOSIS — Z1231 Encounter for screening mammogram for malignant neoplasm of breast: Secondary | ICD-10-CM

## 2023-12-10 ENCOUNTER — Ambulatory Visit
Admission: RE | Admit: 2023-12-10 | Discharge: 2023-12-10 | Disposition: A | Discharge status: Home / Self Care | Source: Ambulatory Visit | Attending: Physician Assistant | Admitting: Physician Assistant

## 2023-12-10 DIAGNOSIS — Z1231 Encounter for screening mammogram for malignant neoplasm of breast: Secondary | ICD-10-CM | POA: Diagnosis present
